# Patient Record
Sex: Male | Born: 1950 | Race: White | Hispanic: No | Smoking: Current every day smoker
Health system: Southern US, Community
[De-identification: ages and names within clinical notes are randomized; demographics above are authoritative.]

## PROBLEM LIST (undated history)

## (undated) DIAGNOSIS — K219 Gastro-esophageal reflux disease without esophagitis: Secondary | ICD-10-CM

## (undated) DIAGNOSIS — N189 Chronic kidney disease, unspecified: Secondary | ICD-10-CM

## (undated) DIAGNOSIS — I10 Essential (primary) hypertension: Secondary | ICD-10-CM

## (undated) DIAGNOSIS — I639 Cerebral infarction, unspecified: Secondary | ICD-10-CM

## (undated) HISTORY — PX: ROTATOR CUFF REPAIR: SHX139

## (undated) HISTORY — PX: SPINE SURGERY: SHX786

## (undated) HISTORY — PX: CHOLECYSTECTOMY: SHX55

## (undated) HISTORY — DX: Chronic kidney disease, unspecified: N18.9

## (undated) HISTORY — DX: Gastro-esophageal reflux disease without esophagitis: K21.9

## (undated) HISTORY — DX: Cerebral infarction, unspecified: I63.9

## (undated) HISTORY — DX: Essential (primary) hypertension: I10

---

## 2015-01-23 NOTE — Progress Notes (Signed)
Subjective:  Patient ID: Jonathon Wang, male    DOB: 01/15/1951  Age: 64 y.o. MRN: [id number]  CC: Establish Care; Hypertension; and Gastroesophageal Reflux   HPI Jonathon Wang presents for HTN onset 12 years ago. At the time had trigeminal neuralgia. A few years later had renal Ca. R nephrectomy performed for cure,but BP difficult to control since that time. Taking 4 meds. Two at max dose plus hydralazine.   Anterior right thigh pain with walking. Radiating into the groin. Onset 2 weeks ago.Has built since to now it is 10/10 pain.Described as Hard pain. Feels like the bone is hurting. Denies weakness.   Patient has a history of reflux, but notes that PPI taken OTC has been helping until recently. Now pain is worsening, but feels different from the reflux. Focused more in the right upper to periumbilical region. Constant, but does not affect appetite.   Pt. Reports urinary frequency, hesitancy and nocturia X 3-4. This is worse if he misses his tamsulosin. Loss of sleep leads to tiredness the next day and diminishes his energy and ability to perform usual aactivities.  History Jonathon Wang has a past medical history of Hypertension; GERD (gastroesophageal reflux disease); CKD (chronic kidney disease); and Stroke ([place]).   He has past surgical history that includes Spine surgery; Rotator cuff repair (Right); and Cholecystectomy.   His family history includes Cancer (age of onset: 10) in his brother; Heart disease in his brother and father; Kidney disease in his brother and mother; Stroke in his father.He reports that he has been smoking Cigarettes.  He started smoking about 50 years ago. He has been smoking about 0.50 packs per day. He does not have any smokeless tobacco history on file. He reports that he drinks about 1.2 oz of alcohol per week. His drug history is not on file.  No outpatient prescriptions prior to visit.   No facility-administered medications prior to visit.    ROS Review of  Systems  Constitutional: Negative for fever, chills, diaphoresis, activity change, appetite change, fatigue and unexpected weight change.  HENT: Negative for congestion, ear pain, hearing loss, postnasal drip, rhinorrhea, sore throat, tinnitus and trouble swallowing.   Eyes: Negative for photophobia, pain, discharge and redness.  Respiratory: Negative for apnea, cough, choking, chest tightness, shortness of breath, wheezing and stridor.   Cardiovascular: Negative for chest pain, palpitations and leg swelling.  Gastrointestinal: Positive for nausea, abdominal pain and abdominal distention. Negative for vomiting, diarrhea, constipation and blood in stool.  Endocrine: Negative for cold intolerance, heat intolerance, polydipsia, polyphagia and polyuria.  Genitourinary: Positive for difficulty urinating. Negative for dysuria, urgency, frequency, hematuria, flank pain, enuresis and genital sores.  Musculoskeletal: Positive for myalgias and arthralgias. Negative for joint swelling.  Skin: Negative for color change, rash and wound.  Allergic/Immunologic: Negative for immunocompromised state.  Neurological: Negative for dizziness, tremors, seizures, syncope, facial asymmetry, speech difficulty, weakness, light-headedness, numbness and headaches.  Hematological: Does not bruise/bleed easily.  Psychiatric/Behavioral: Negative for suicidal ideas, hallucinations, behavioral problems, confusion, sleep disturbance, dysphoric mood, decreased concentration and agitation. The patient is not nervous/anxious and is not hyperactive.     Objective:  BP 161/69 mmHg  Pulse 106  Temp(Src) 97.9 F (36.6 C) (Oral)  Ht '5\' 5"'  (1.651 m)  Wt 136 lb 6.4 oz (61.871 kg)  BMI 22.70 kg/m2  SpO2 97%  BP Readings from Last 3 Encounters:  01/24/15 161/69    Wt Readings from Last 3 Encounters:  01/24/15 136 lb 6.4 oz (61.871 kg)  Physical Exam  Constitutional: He is oriented to person, place, and time. He appears  well-developed and well-nourished. No distress.  HENT:  Head: Normocephalic and atraumatic.  Right Ear: External ear normal.  Left Ear: External ear normal.  Nose: Nose normal.  Mouth/Throat: Oropharynx is clear and moist.  Eyes: Conjunctivae and EOM are normal. Pupils are equal, round, and reactive to light.  Neck: Normal range of motion. Neck supple. No thyromegaly present.  Cardiovascular: Normal rate, regular rhythm and normal heart sounds.   No murmur heard. Pulmonary/Chest: Effort normal and breath sounds normal. No respiratory distress. He has no wheezes. He has no rales.  Abdominal: Soft. Bowel sounds are normal. He exhibits no distension. There is no tenderness.  Lymphadenopathy:    He has no cervical adenopathy.  Neurological: He is alert and oriented to person, place, and time. He has normal reflexes.  Skin: Skin is warm and dry.  Psychiatric: He has a normal mood and affect. His behavior is normal. Judgment and thought content normal.    No results found for: HGBA1C  No results found for: WBC, HGB, HCT, PLT, GLUCOSE, CHOL, TRIG, HDL, LDLDIRECT, LDLCALC, ALT, AST, NA, K, CL, CREATININE, BUN, CO2, TSH, PSA, INR, GLUF, HGBA1C, MICROALBUR  Patient was never admitted.  Assessment & Plan:   Jonathon Wang was seen today for establish care, hypertension and gastroesophageal reflux.  Diagnoses and all orders for this visit:  Right thigh pain -     CBC with Differential/Platelet -     CMP14+EGFR -     Sedimentation rate -     DG Pelvis 1-2 Views; Future -     DG FEMUR, MIN 2 VIEWS RIGHT; Future  Periumbilical abdominal pain -     CBC with Differential/Platelet -     CMP14+EGFR -     Lipase -     DG Abd Acute W/Chest; Future -     CT Abdomen Pelvis W Contrast; Future -     US Abdomen Limited RUQ; Future  Renovascular hypertension -     CBC with Differential/Platelet -     CMP14+EGFR -     Lipid panel -     NM Renal Imaging Flow W/Pharm; Future -     POCT UA -  Microscopic Only -     POCT urinalysis dipstick  BPH (benign prostatic hyperplasia) -     CBC with Differential/Platelet -     CMP14+EGFR  Screening for prostate cancer -     PSA, Medicare  Other orders -     tamsulosin (FLOMAX) 0.4 MG CAPS capsule; Take 2 capsules (0.8 mg total) by mouth daily. -     hydrALAZINE (APRESOLINE) 50 MG tablet; Take 1 tablet (50 mg total) by mouth 3 (three) times daily. For prostate -     predniSONE (DELTASONE) 10 MG tablet; Take 5 daily for 3 days followed by 4,3,2 and 1 for 3 days each. -     gabapentin (NEURONTIN) 300 MG capsule; Take 1 capsule (300 mg total) by mouth at bedtime. For right leg pain   I have changed Mr. Schneller tamsulosin and hydrALAZINE. I am also having him start on predniSONE and gabapentin. Additionally, I am having him maintain his amLODipine, lisinopril, metoprolol tartrate, and omeprazole.  Meds ordered this encounter  Medications  . amLODipine (NORVASC) 10 MG tablet    Sig: Take 10 mg by mouth daily.    Refill:  1  . lisinopril (PRINIVIL,ZESTRIL) 40 MG tablet    Sig: Take  40 mg by mouth daily.    Refill:  1  . DISCONTD: tamsulosin (FLOMAX) 0.4 MG CAPS capsule    Sig: Take 0.4 mg by mouth daily.    Refill:  1  . metoprolol tartrate (LOPRESSOR) 25 MG tablet    Sig: Take 12.5 mg by mouth 2 (two) times daily.    Refill:  1  . DISCONTD: hydrALAZINE (APRESOLINE) 50 MG tablet    Sig: Take 50 mg by mouth 2 (two) times daily.    Refill:  2  . omeprazole (PRILOSEC) 20 MG capsule    Sig: Take 40 mg by mouth daily.    Refill:  0  . tamsulosin (FLOMAX) 0.4 MG CAPS capsule    Sig: Take 2 capsules (0.8 mg total) by mouth daily.    Dispense:  60 capsule    Refill:  5  . hydrALAZINE (APRESOLINE) 50 MG tablet    Sig: Take 1 tablet (50 mg total) by mouth 3 (three) times daily. For prostate    Dispense:  90 tablet    Refill:  2  . predniSONE (DELTASONE) 10 MG tablet    Sig: Take 5 daily for 3 days followed by 4,3,2 and 1 for 3 days  each.    Dispense:  45 tablet    Refill:  0  . gabapentin (NEURONTIN) 300 MG capsule    Sig: Take 1 capsule (300 mg total) by mouth at bedtime. For right leg pain    Dispense:  30 capsule    Refill:  0   XR abd shows increased bowel gas and possible aealy obstruction. There is no apparent mass. Some changes at the right lateral abd may be based on right nephrectomy causing shifting of positioning of vicera.  Pelvis/hip shows moderate arthritic changes at the acetabulum with decreased joint space.   Follow-up: Return in about 2 weeks (around 02/07/2015).  Claretta Fraise, M.D.

## 2015-01-24 ENCOUNTER — Ambulatory Visit (INDEPENDENT_AMBULATORY_CARE_PROVIDER_SITE_OTHER): Payer: Medicare HMO

## 2015-01-24 ENCOUNTER — Other Ambulatory Visit (HOSPITAL_COMMUNITY): Payer: Self-pay

## 2015-01-24 ENCOUNTER — Ambulatory Visit (INDEPENDENT_AMBULATORY_CARE_PROVIDER_SITE_OTHER): Payer: Medicare HMO | Admitting: Family Medicine

## 2015-01-24 ENCOUNTER — Encounter: Payer: Self-pay | Admitting: Family Medicine

## 2015-01-24 VITALS — BP 161/69 | HR 106 | Temp 97.9°F | Ht 65.0 in | Wt 136.4 lb

## 2015-01-24 DIAGNOSIS — M79651 Pain in right thigh: Secondary | ICD-10-CM

## 2015-01-24 DIAGNOSIS — Z125 Encounter for screening for malignant neoplasm of prostate: Secondary | ICD-10-CM

## 2015-01-24 DIAGNOSIS — R1033 Periumbilical pain: Secondary | ICD-10-CM | POA: Diagnosis not present

## 2015-01-24 DIAGNOSIS — N400 Enlarged prostate without lower urinary tract symptoms: Secondary | ICD-10-CM | POA: Diagnosis not present

## 2015-01-24 DIAGNOSIS — I150 Renovascular hypertension: Secondary | ICD-10-CM

## 2015-01-24 LAB — POCT URINALYSIS DIPSTICK
Bilirubin, UA: NEGATIVE
GLUCOSE UA: NEGATIVE
Ketones, UA: NEGATIVE
Leukocytes, UA: NEGATIVE
NITRITE UA: NEGATIVE
PH UA: 6
RBC UA: NEGATIVE
SPEC GRAV UA: 1.015
UROBILINOGEN UA: NEGATIVE

## 2015-01-24 LAB — POCT UA - MICROSCOPIC ONLY
BACTERIA, U MICROSCOPIC: NEGATIVE
CASTS, UR, LPF, POC: NEGATIVE
CRYSTALS, UR, HPF, POC: NEGATIVE
Mucus, UA: NEGATIVE
RBC, urine, microscopic: NEGATIVE
YEAST UA: NEGATIVE

## 2015-01-24 MED ORDER — HYDRALAZINE HCL 50 MG PO TABS
50.0000 mg | ORAL_TABLET | Freq: Three times a day (TID) | ORAL | Status: DC
Start: 2015-01-24 — End: 2015-02-07

## 2015-01-24 MED ORDER — PREDNISONE 10 MG PO TABS: ORAL_TABLET | ORAL | Status: DC

## 2015-01-24 MED ORDER — GABAPENTIN 300 MG PO CAPS: 300.0000 mg | ORAL_CAPSULE | Freq: Every day | ORAL | Status: DC

## 2015-01-24 MED ORDER — TAMSULOSIN HCL 0.4 MG PO CAPS: 0.8000 mg | ORAL_CAPSULE | Freq: Every day | ORAL | Status: DC

## 2015-01-25 ENCOUNTER — Other Ambulatory Visit: Payer: Self-pay | Admitting: Family Medicine

## 2015-01-25 ENCOUNTER — Ambulatory Visit (HOSPITAL_COMMUNITY)
Admission: RE | Admit: 2015-01-25 | Discharge: 2015-01-25 | Disposition: A | Discharge status: Home / Self Care | Payer: Medicare HMO | Source: Ambulatory Visit | Attending: Family Medicine | Admitting: Family Medicine

## 2015-01-25 DIAGNOSIS — D3502 Benign neoplasm of left adrenal gland: Secondary | ICD-10-CM | POA: Diagnosis not present

## 2015-01-25 DIAGNOSIS — Z85528 Personal history of other malignant neoplasm of kidney: Secondary | ICD-10-CM | POA: Diagnosis not present

## 2015-01-25 DIAGNOSIS — Z9049 Acquired absence of other specified parts of digestive tract: Secondary | ICD-10-CM | POA: Diagnosis not present

## 2015-01-25 DIAGNOSIS — N400 Enlarged prostate without lower urinary tract symptoms: Secondary | ICD-10-CM | POA: Diagnosis not present

## 2015-01-25 DIAGNOSIS — R1033 Periumbilical pain: Secondary | ICD-10-CM

## 2015-01-25 DIAGNOSIS — N200 Calculus of kidney: Secondary | ICD-10-CM | POA: Diagnosis not present

## 2015-01-25 DIAGNOSIS — R1011 Right upper quadrant pain: Secondary | ICD-10-CM | POA: Diagnosis present

## 2015-01-25 DIAGNOSIS — R93421 Abnormal radiologic findings on diagnostic imaging of right kidney: Secondary | ICD-10-CM | POA: Diagnosis not present

## 2015-01-25 LAB — CMP14+EGFR
ALK PHOS: 47 IU/L (ref 39–117)
ALT: 10 IU/L (ref 0–44)
AST: 19 IU/L (ref 0–40)
Albumin/Globulin Ratio: 1.9 (ref 1.1–2.5)
Albumin: 4.5 g/dL (ref 3.6–4.8)
BUN/Creatinine Ratio: 20 (ref 10–22)
BUN: 36 mg/dL — AB (ref 8–27)
Bilirubin Total: 0.7 mg/dL (ref 0.0–1.2)
CO2: 22 mmol/L (ref 18–29)
CREATININE: 1.83 mg/dL — AB (ref 0.76–1.27)
Calcium: 9.7 mg/dL (ref 8.6–10.2)
Chloride: 103 mmol/L (ref 97–106)
GFR calc Af Amer: 44 mL/min/{1.73_m2} — ABNORMAL LOW (ref >59)
GFR calc non Af Amer: 38 mL/min/{1.73_m2} — ABNORMAL LOW (ref >59)
GLUCOSE: 95 mg/dL (ref 65–99)
Globulin, Total: 2.4 g/dL (ref 1.5–4.5)
Potassium: 5.3 mmol/L — ABNORMAL HIGH (ref 3.5–5.2)
SODIUM: 142 mmol/L (ref 136–144)
Total Protein: 6.9 g/dL (ref 6.0–8.5)

## 2015-01-25 LAB — SEDIMENTATION RATE: Sed Rate: 18 mm/hr (ref 0–30)

## 2015-01-25 LAB — LIPID PANEL
CHOLESTEROL TOTAL: 178 mg/dL (ref 100–199)
Chol/HDL Ratio: 3 ratio units (ref 0.0–5.0)
HDL: 60 mg/dL (ref >39)
LDL CALC: 82 mg/dL (ref 0–99)
TRIGLYCERIDES: 179 mg/dL — AB (ref 0–149)
VLDL CHOLESTEROL CAL: 36 mg/dL (ref 5–40)

## 2015-01-25 LAB — CBC WITH DIFFERENTIAL/PLATELET
BASOS ABS: 0 10*3/uL (ref 0.0–0.2)
Basos: 1 %
EOS (ABSOLUTE): 0.2 10*3/uL (ref 0.0–0.4)
Eos: 2 %
Hematocrit: 39.8 % (ref 37.5–51.0)
Hemoglobin: 13 g/dL (ref 12.6–17.7)
Immature Grans (Abs): 0 10*3/uL (ref 0.0–0.1)
Immature Granulocytes: 0 %
LYMPHS ABS: 2 10*3/uL (ref 0.7–3.1)
Lymphs: 26 %
MCH: 32.3 pg (ref 26.6–33.0)
MCHC: 32.7 g/dL (ref 31.5–35.7)
MCV: 99 fL — ABNORMAL HIGH (ref 79–97)
MONOS ABS: 0.7 10*3/uL (ref 0.1–0.9)
Monocytes: 9 %
NEUTROS PCT: 62 %
Neutrophils Absolute: 4.7 10*3/uL (ref 1.4–7.0)
PLATELETS: 400 10*3/uL — AB (ref 150–379)
RBC: 4.03 x10E6/uL — AB (ref 4.14–5.80)
RDW: 13.2 % (ref 12.3–15.4)
WBC: 7.5 10*3/uL (ref 3.4–10.8)

## 2015-01-25 LAB — PSA (REFLEX TO FREE) (SERIAL): Prostate Specific Ag, Serum: 3.5 ng/mL (ref 0.0–4.0)

## 2015-01-25 LAB — POCT I-STAT CREATININE: Creatinine, Ser: 2.3 mg/dL — ABNORMAL HIGH (ref 0.61–1.24)

## 2015-01-25 LAB — LIPASE: LIPASE: 97 U/L — AB (ref 0–59)

## 2015-01-25 MED ORDER — IOHEXOL 300 MG/ML  SOLN: 100.0000 mL | Freq: Once | INTRAMUSCULAR | Status: DC | PRN

## 2015-01-26 ENCOUNTER — Other Ambulatory Visit: Payer: Self-pay | Admitting: *Deleted

## 2015-01-26 DIAGNOSIS — N2889 Other specified disorders of kidney and ureter: Secondary | ICD-10-CM

## 2015-02-01 ENCOUNTER — Other Ambulatory Visit: Payer: Self-pay | Admitting: Family Medicine

## 2015-02-02 ENCOUNTER — Other Ambulatory Visit: Payer: Self-pay | Admitting: Family Medicine

## 2015-02-04 ENCOUNTER — Other Ambulatory Visit: Payer: Self-pay | Admitting: Family Medicine

## 2015-02-04 ENCOUNTER — Telehealth: Payer: Self-pay | Admitting: Family Medicine

## 2015-02-07 ENCOUNTER — Encounter: Payer: Self-pay | Admitting: Family Medicine

## 2015-02-07 ENCOUNTER — Ambulatory Visit (INDEPENDENT_AMBULATORY_CARE_PROVIDER_SITE_OTHER): Payer: Medicare HMO | Admitting: Family Medicine

## 2015-02-07 VITALS — BP 158/76 | HR 71 | Temp 97.7°F | Ht 65.0 in | Wt 137.2 lb

## 2015-02-07 DIAGNOSIS — M79651 Pain in right thigh: Secondary | ICD-10-CM | POA: Diagnosis not present

## 2015-02-07 DIAGNOSIS — I150 Renovascular hypertension: Secondary | ICD-10-CM

## 2015-02-07 MED ORDER — DICLOFENAC SODIUM 75 MG PO TBEC: 75.0000 mg | DELAYED_RELEASE_TABLET | Freq: Two times a day (BID) | ORAL | Status: DC

## 2015-02-07 MED ORDER — LISINOPRIL 40 MG PO TABS: 40.0000 mg | ORAL_TABLET | Freq: Every day | ORAL | Status: DC

## 2015-02-07 MED ORDER — HYDRALAZINE HCL 50 MG PO TABS: 50.0000 mg | ORAL_TABLET | Freq: Three times a day (TID) | ORAL | Status: DC

## 2015-02-07 MED ORDER — GABAPENTIN 300 MG PO CAPS
ORAL_CAPSULE | ORAL | Status: DC
Start: 2015-02-07 — End: 2015-03-30

## 2015-02-07 MED ORDER — AMLODIPINE BESYLATE 10 MG PO TABS: 10.0000 mg | ORAL_TABLET | Freq: Every day | ORAL | Status: DC

## 2015-02-07 NOTE — Progress Notes (Signed)
Subjective:  Patient ID: Jonathon Wang, male    DOB: Jun 11, 1950  Age: 64 y.o. MRN: [id number]  CC: Hypertension and Abdominal Pain   HPI Masami Samson presents for ABd pain gone, but R leg no better. Feels like bones hurt. Severe pain still. Sharp, hard pain. Located at [facility] area bilaterally. Also radiates to anterior right thigh. Pt. Declines w/u for renal mass. Makes ambulation difficult and painful. Ambulation exacerbates the pain. Patient notes that the pain is definitely anterior and not similar to previous episodes of sciatica.   follow-up of hypertension. Patient has no history of headache chest pain or shortness of breath or recent cough. Patient also denies symptoms of TIA such as numbness weakness lateralizing. Patient does not check  blood pressure at home . Patient denies side effects from his medication. States taking it regularly. However he did not increase the hydralazine from twice a day to 3 times a day as recommended last visit    Zai has a past medical history of Hypertension; GERD (gastroesophageal reflux disease); CKD (chronic kidney disease); and Stroke ([place]).   He has past surgical history that includes Spine surgery; Rotator cuff repair (Right); and Cholecystectomy.   His family history includes Cancer (age of onset: 33) in his brother; Heart disease in his brother and father; Kidney disease in his brother and mother; Stroke in his father.He reports that he has been smoking Cigarettes.  He started smoking about 50 years ago. He has been smoking about 0.50 packs per day. He does not have any smokeless tobacco history on file. He reports that he drinks about 1.2 oz of alcohol per week. His drug history is not on file.  Outpatient Prescriptions Prior to Visit  Medication Sig Dispense Refill  . metoprolol tartrate (LOPRESSOR) 25 MG tablet Take 12.5 mg by mouth 2 (two) times daily.  1  . omeprazole (PRILOSEC) 20 MG capsule TAKE 2 CAPSULES BY MOUTH EVERY DAY 60 capsule 5    . tamsulosin (FLOMAX) 0.4 MG CAPS capsule Take 2 capsules (0.8 mg total) by mouth daily. 60 capsule 5  . amLODipine (NORVASC) 10 MG tablet Take 10 mg by mouth daily.  1  . hydrALAZINE (APRESOLINE) 50 MG tablet Take 1 tablet (50 mg total) by mouth 3 (three) times daily. For prostate 90 tablet 2  . lisinopril (PRINIVIL,ZESTRIL) 40 MG tablet Take 40 mg by mouth daily.  1  . gabapentin (NEURONTIN) 300 MG capsule Take 1 capsule (300 mg total) by mouth at bedtime. For right leg pain (Patient not taking: Reported on 02/07/2015) 30 capsule 0  . predniSONE (DELTASONE) 10 MG tablet Take 5 daily for 3 days followed by 4,3,2 and 1 for 3 days each. (Patient not taking: Reported on 02/07/2015) 45 tablet 0   No facility-administered medications prior to visit.    ROS Review of Systems  Constitutional: Negative for fever, chills and diaphoresis.  HENT: Negative for congestion, rhinorrhea and sore throat.   Respiratory: Negative for cough, shortness of breath and wheezing.   Cardiovascular: Negative for chest pain.  Gastrointestinal: Negative for nausea, vomiting, abdominal pain, diarrhea, constipation and abdominal distention.  Genitourinary: Negative for dysuria and frequency.  Musculoskeletal: Positive for back pain, joint swelling and arthralgias.  Skin: Negative for rash.  Neurological: Negative for headaches.    Objective:  BP 158/76 mmHg  Pulse 71  Temp(Src) 97.7 F (36.5 C) (Oral)  Ht 5\' 5"  (1.651 m)  Wt 137 lb 3.2 oz (62.234 kg)  BMI 22.83 kg/m2  SpO2  97%  BP Readings from Last 3 Encounters:  02/07/15 158/76  01/24/15 161/69    Wt Readings from Last 3 Encounters:  02/07/15 137 lb 3.2 oz (62.234 kg)  01/24/15 136 lb 6.4 oz (61.871 kg)     Physical Exam  Constitutional: He is oriented to person, place, and time. He appears well-developed and well-nourished. No distress.  HENT:  Head: Normocephalic and atraumatic.  Right Ear: External ear normal.  Left Ear: External ear  normal.  Nose: Nose normal.  Mouth/Throat: Oropharynx is clear and moist.  Eyes: Conjunctivae and EOM are normal. Pupils are equal, round, and reactive to light.  Neck: Normal range of motion. Neck supple. No thyromegaly present.  Cardiovascular: Normal rate, regular rhythm and normal heart sounds.   No murmur heard. Pulmonary/Chest: Effort normal and breath sounds normal. No respiratory distress. He has no wheezes. He has no rales.  Abdominal: Soft. Bowel sounds are normal. He exhibits no distension. There is no tenderness.  Musculoskeletal: Normal range of motion. He exhibits tenderness (right anterior thigh and b bilateral SI joints for palpation). He exhibits no edema.  Lymphadenopathy:    He has no cervical adenopathy.  Neurological: He is alert and oriented to person, place, and time. He has normal reflexes.  Skin: Skin is warm and dry.  Psychiatric: He has a normal mood and affect. His behavior is normal. Judgment and thought content normal.    No results found for: HGBA1C  Lab Results  Component Value Date   WBC 7.5 01/24/2015   HCT 39.8 01/24/2015   GLUCOSE 95 01/24/2015   CHOL 178 01/24/2015   TRIG 179* 01/24/2015   HDL 60 01/24/2015   LDLCALC 82 01/24/2015   ALT 10 01/24/2015   AST 19 01/24/2015   NA 142 01/24/2015   K 5.3* 01/24/2015   CL 103 01/24/2015   CREATININE 2.30* 01/25/2015   BUN 36* 01/24/2015   CO2 22 01/24/2015   PSA 3.5 01/24/2015    Ct Abdomen Pelvis Wo Contrast  01/25/2015  CLINICAL DATA:  Right upper quadrant pain for 4 days. History of right kidney cancer 7 years ago. Creatinine 2.3. EXAM: CT ABDOMEN AND PELVIS WITHOUT CONTRAST TECHNIQUE: Multidetector CT imaging of the abdomen and pelvis was performed following the standard protocol without IV contrast. COMPARISON:  None. FINDINGS: Lower chest: No acute findings. Mild scarring/atelectasis at each lung base. Hepatobiliary: Status post cholecystectomy. No surgical complicating feature identified. No  focal masses or lesions identified within the liver on this nonenhanced exam. Pancreas: Unremarkable.  No mass or inflammatory change identified. Spleen: Within normal limits in size. Adrenals/Urinary Tract: Left adrenal mass measures 3.1 x 1.7 x 2.6 cm, consistent with benign lipid-rich adrenal adenoma based on CT density measurements. Right adrenal gland is unremarkable. Right kidney is surgically absent. No evidence of local recurrent disease. Benign cyst within the upper pole of the left kidney anteriorly measures 1.5 x 1.2 cm. Hyperdense mass exophytic to the posterior cortex of the left kidney measures 1.3 x 1 cm, most likely a benign hemorrhagic cyst. Subtle 2.1 x 1.3 cm lobular mass exophytic to the lateral cortex of the lower left kidney is of uncertain significance and difficult to definitively characterize on this nonenhanced study. 1 mm nonobstructing left renal stone also noted. Stomach/Bowel: Stomach is unremarkable. There are no dilated large or small bowel loops. No bowel wall thickening or evidence of bowel wall inflammation seen. Moderate amount of stool and gas within the nondistended colon. Appendix is not convincingly seen but  there are no inflammatory changes in the right lower quadrant to suggest acute appendicitis. Vascular/Lymphatic: Prominent atherosclerotic vascular calcifications are seen along the walls of the normal-caliber abdominal aorta and pelvic vasculature. Reproductive: Prostate gland is moderately enlarged causing slight mass effect on the bladder base. Otherwise unremarkable. Other: No free fluid or abscess collections seen. No free intraperitoneal air. No enlarged lymph nodes seen. Musculoskeletal: Degenerative changes throughout the thoracolumbar spine but no acute osseous abnormality. Incidental note made of a benign bone island within the right femoral head. Superficial soft tissues are unremarkable. IMPRESSION: 1. Subtle nonspecific lobular mass exophytic to the lateral  cortex of the lower left kidney (series 2, image 36; series 3, image 46), measuring 2.1 x 1.3 cm, difficult to characterize without intravascular contrast. Neoplastic mass cannot be excluded. Recommend further characterization with renal MRI. 2. Probably benign simple and hemorrhagic cysts within the upper left kidney. 3. 1 mm nonobstructing left renal stone. 4. Benign left adrenal adenoma. 5. Status post surgical resection of the right kidney. No evidence of local residual or recurrent disease. No surgical complicating feature. 6. Status post cholecystectomy. 7. Moderate prostate gland enlargement causing slight mass effect on the bladder base. 8. Remainder of the abdomen and pelvis CT is unremarkable. No free fluid or inflammatory change. No bowel obstruction or evidence of bowel wall inflammation. No evidence of acute solid organ abnormality. Electronically Signed   By: Franki Cabot M.D.   On: 01/25/2015 13:36    Assessment & Plan:   There are no diagnoses linked to this encounter. I have discontinued Mr. Strupp's predniSONE. I have also changed his lisinopril, amLODipine, hydrALAZINE, and gabapentin. Additionally, I am having him start on diclofenac. Lastly, I am having him maintain his metoprolol tartrate, tamsulosin, and omeprazole.  Meds ordered this encounter  Medications  . lisinopril (PRINIVIL,ZESTRIL) 40 MG tablet    Sig: Take 1 tablet (40 mg total) by mouth daily.    Dispense:  90 tablet    Refill:  1  . amLODipine (NORVASC) 10 MG tablet    Sig: Take 1 tablet (10 mg total) by mouth daily. For blood pressure    Dispense:  90 tablet    Refill:  1  . hydrALAZINE (APRESOLINE) 50 MG tablet    Sig: Take 1 tablet (50 mg total) by mouth 3 (three) times daily. For blood pressure    Dispense:  90 tablet    Refill:  2  . gabapentin (NEURONTIN) 300 MG capsule    Sig: For right leg pain. One at night for 3 days. Then 2 for three days, then 3 for three days, then 4 at night.    Dispense:  30  capsule    Refill:  0  . diclofenac (VOLTAREN) 75 MG EC tablet    Sig: Take 1 tablet (75 mg total) by mouth 2 (two) times daily.    Dispense:  60 tablet    Refill:  2     Follow-up: Return in about 4 weeks (around 03/07/2015) for hypertension, Arthritis.  Claretta Fraise, M.D.

## 2015-02-09 ENCOUNTER — Ambulatory Visit (HOSPITAL_COMMUNITY): Payer: Medicare HMO

## 2015-02-09 ENCOUNTER — Encounter (HOSPITAL_COMMUNITY): Payer: Medicare HMO

## 2015-03-07 ENCOUNTER — Other Ambulatory Visit: Payer: Self-pay | Admitting: Family Medicine

## 2015-03-17 ENCOUNTER — Ambulatory Visit: Payer: Medicare HMO | Admitting: Family Medicine

## 2015-03-22 ENCOUNTER — Ambulatory Visit: Payer: Medicare HMO | Admitting: Family Medicine

## 2015-03-28 ENCOUNTER — Ambulatory Visit: Payer: Medicare HMO | Admitting: Family Medicine

## 2015-03-30 ENCOUNTER — Ambulatory Visit (INDEPENDENT_AMBULATORY_CARE_PROVIDER_SITE_OTHER): Payer: Medicare HMO | Admitting: Family Medicine

## 2015-03-30 ENCOUNTER — Encounter: Payer: Self-pay | Admitting: Family Medicine

## 2015-03-30 ENCOUNTER — Ambulatory Visit (INDEPENDENT_AMBULATORY_CARE_PROVIDER_SITE_OTHER): Payer: Medicare HMO

## 2015-03-30 VITALS — BP 166/88 | HR 73 | Temp 98.0°F | Ht 65.0 in | Wt 134.0 lb

## 2015-03-30 DIAGNOSIS — M545 Low back pain, unspecified: Secondary | ICD-10-CM

## 2015-03-30 DIAGNOSIS — I150 Renovascular hypertension: Secondary | ICD-10-CM | POA: Diagnosis not present

## 2015-03-30 DIAGNOSIS — M79651 Pain in right thigh: Secondary | ICD-10-CM

## 2015-03-30 MED ORDER — HYDRALAZINE HCL 100 MG PO TABS
100.0000 mg | ORAL_TABLET | Freq: Three times a day (TID) | ORAL | Status: DC
Start: 2015-03-30 — End: 2015-07-29

## 2015-03-30 MED ORDER — GABAPENTIN 400 MG PO CAPS: ORAL_CAPSULE | ORAL | Status: DC

## 2015-03-30 NOTE — Progress Notes (Signed)
Subjective:  Patient ID: Jonathon Wang, male    DOB: 07-02-1950  Age: 65 y.o. MRN: [id number]  CC: Hypertension and Pain   HPI Christien Petrauskas presents for  follow-up of hypertension. Patient has no history of headache chest pain or shortness of breath or recent cough. Patient also denies symptoms of TIA such as numbness weakness lateralizing. Patient checks  blood pressure at home and has not had any elevated readings recently. Patient denies side effects from his medication. States taking it regularly.  Moderately severe recurrent low back pain is radiating into the right posterior thigh. Pain onset remote. Pain in back is sharp at times with dull ache in between episodes.    History Lawrene has a past medical history of Hypertension; GERD (gastroesophageal reflux disease); CKD (chronic kidney disease); and Stroke ([place]).   He has past surgical history that includes Spine surgery; Rotator cuff repair (Right); and Cholecystectomy.   His family history includes Cancer (age of onset: 103) in his brother; Heart disease in his brother and father; Kidney disease in his brother and mother; Stroke in his father.He reports that he has been smoking Cigarettes.  He started smoking about 50 years ago. He has been smoking about 0.50 packs per day. He does not have any smokeless tobacco history on file. He reports that he drinks about 1.2 oz of alcohol per week. His drug history is not on file.  Current Outpatient Prescriptions on File Prior to Visit  Medication Sig Dispense Refill  . amLODipine (NORVASC) 10 MG tablet Take 1 tablet (10 mg total) by mouth daily. For blood pressure 90 tablet 1  . lisinopril (PRINIVIL,ZESTRIL) 40 MG tablet Take 1 tablet (40 mg total) by mouth daily. 90 tablet 1  . metoprolol tartrate (LOPRESSOR) 25 MG tablet TAKE 1/2 TABLET BY MOUTH TWICE DAILY 30 tablet 4  . omeprazole (PRILOSEC) 20 MG capsule TAKE 2 CAPSULES BY MOUTH EVERY DAY 60 capsule 5  . tamsulosin (FLOMAX) 0.4 MG CAPS  capsule Take 2 capsules (0.8 mg total) by mouth daily. 60 capsule 5   No current facility-administered medications on file prior to visit.    ROS Review of Systems  Constitutional: Negative for fever, chills, diaphoresis and unexpected weight change.  HENT: Negative for congestion, hearing loss, rhinorrhea and sore throat.   Eyes: Negative for visual disturbance.  Respiratory: Negative for cough and shortness of breath.   Cardiovascular: Negative for chest pain.  Gastrointestinal: Negative for abdominal pain, diarrhea and constipation.  Genitourinary: Negative for dysuria and flank pain.  Musculoskeletal: Positive for myalgias and back pain. Negative for joint swelling and arthralgias.  Skin: Negative for rash.  Neurological: Negative for dizziness and headaches.  Psychiatric/Behavioral: Negative for sleep disturbance and dysphoric mood.    Objective:  BP 166/88 mmHg  Pulse 73  Temp(Src) 98 F (36.7 C) (Oral)  Ht 5\' 5"  (1.651 m)  Wt 134 lb (60.782 kg)  BMI 22.30 kg/m2  SpO2 96%  BP Readings from Last 3 Encounters:  03/30/15 166/88  02/07/15 158/76  01/24/15 161/69    Wt Readings from Last 3 Encounters:  03/30/15 134 lb (60.782 kg)  02/07/15 137 lb 3.2 oz (62.234 kg)  01/24/15 136 lb 6.4 oz (61.871 kg)     Physical Exam  Constitutional: He is oriented to person, place, and time. He appears well-developed and well-nourished. No distress.  HENT:  Head: Normocephalic and atraumatic.  Right Ear: External ear normal.  Left Ear: External ear normal.  Nose: Nose normal.  Mouth/Throat:  Oropharynx is clear and moist.  Eyes: Conjunctivae and EOM are normal. Pupils are equal, round, and reactive to light.  Neck: Normal range of motion. Neck supple. No thyromegaly present.  Cardiovascular: Normal rate, regular rhythm and normal heart sounds.   No murmur heard. Pulmonary/Chest: Effort normal and breath sounds normal. No respiratory distress. He has no wheezes. He has no  rales.  Abdominal: Soft. Bowel sounds are normal. He exhibits no distension. There is no tenderness.  Musculoskeletal: Normal range of motion. He exhibits tenderness (right lumbar region paraspinous spasm noted L5-S1. SLR positive. Saralyn Pilar neg. on right.).  Lymphadenopathy:    He has no cervical adenopathy.  Neurological: He is alert and oriented to person, place, and time. He has normal reflexes.  Skin: Skin is warm and dry.  Psychiatric: He has a normal mood and affect. His behavior is normal. Judgment and thought content normal.    No results found for: HGBA1C  Lab Results  Component Value Date   WBC 7.5 01/24/2015   HCT 39.8 01/24/2015   PLT 400* 01/24/2015   GLUCOSE 95 01/24/2015   CHOL 178 01/24/2015   TRIG 179* 01/24/2015   HDL 60 01/24/2015   LDLCALC 82 01/24/2015   ALT 10 01/24/2015   AST 19 01/24/2015   NA 142 01/24/2015   K 5.3* 01/24/2015   CL 103 01/24/2015   CREATININE 2.30* 01/25/2015   BUN 36* 01/24/2015   CO2 22 01/24/2015    Ct Abdomen Pelvis Wo Contrast  01/25/2015  CLINICAL DATA:  Right upper quadrant pain for 4 days. History of right kidney cancer 7 years ago. Creatinine 2.3. EXAM: CT ABDOMEN AND PELVIS WITHOUT CONTRAST TECHNIQUE: Multidetector CT imaging of the abdomen and pelvis was performed following the standard protocol without IV contrast. COMPARISON:  None. FINDINGS: Lower chest: No acute findings. Mild scarring/atelectasis at each lung base. Hepatobiliary: Status post cholecystectomy. No surgical complicating feature identified. No focal masses or lesions identified within the liver on this non enhanced exam. Pancreas: Unremarkable.  No mass or inflammatory change identified. Spleen: Within normal limits in size. Adrenals/Urinary Tract: Left adrenal mass measures 3.1 x 1.7 x 2.6 cm, consistent with benign lipid-rich adrenal adenoma based on CT density measurements. Right adrenal gland is unremarkable. Right kidney is surgically absent. No evidence of  local recurrent disease. Benign cyst within the upper pole of the left kidney anteriorly measures 1.5 x 1.2 cm. Hyperdense mass exophytic to the posterior cortex of the left kidney measures 1.3 x 1 cm, most likely a benign hemorrhagic cyst. Subtle 2.1 x 1.3 cm lobular mass exophytic to the lateral cortex of the lower left kidney is of uncertain significance and difficult to definitively characterize on this nonenhanced study. 1 mm nonobstructing left renal stone also noted. Stomach/Bowel: Stomach is unremarkable. There are no dilated large or small bowel loops. No bowel wall thickening or evidence of bowel wall inflammation seen. Moderate amount of stool and gas within the nondistended colon. Appendix is not convincingly seen but there are no inflammatory changes in the right lower quadrant to suggest acute appendicitis. Vascular/Lymphatic: Prominent atherosclerotic vascular calcifications are seen along the walls of the normal-caliber abdominal aorta and pelvic vasculature. Reproductive: Prostate gland is moderately enlarged causing slight mass effect on the bladder base. Otherwise unremarkable. Other: No free fluid or abscess collections seen. No free intraperitoneal air. No enlarged lymph nodes seen. Musculoskeletal: Degenerative changes throughout the thoracolumbar spine but no acute osseous abnormality. Incidental note made of a benign bone island within the  right femoral head. Superficial soft tissues are unremarkable. IMPRESSION: 1. Subtle nonspecific lobular mass exophytic to the lateral cortex of the lower left kidney (series 2, image 36; series 3, image 46), measuring 2.1 x 1.3 cm, difficult to characterize without intravascular contrast. Neoplastic mass cannot be excluded. Recommend further characterization with renal MRI. 2. Probably benign simple and hemorrhagic cysts within the upper left kidney. 3. 1 mm nonobstructing left renal stone. 4. Benign left adrenal adenoma. 5. Status post surgical resection  of the right kidney. No evidence of local residual or recurrent disease. No surgical complicating feature. 6. Status post cholecystectomy. 7. Moderate prostate gland enlargement causing slight mass effect on the bladder base. 8. Remainder of the abdomen and pelvis CT is unremarkable. No free fluid or inflammatory change. No bowel obstruction or evidence of bowel wall inflammation. No evidence of acute solid organ abnormality. Electronically Signed   By: Franki Cabot M.D.   On: 01/25/2015 13:36    Assessment & Plan:   Tonnie was seen today for hypertension and pain.  Diagnoses and all orders for this visit:  Renovascular hypertension  Right thigh pain -     DG Lumbar Spine 2-3 Views; Future  Right-sided low back pain without sciatica -     DG Lumbar Spine 2-3 Views; Future  Other orders -     gabapentin (NEURONTIN) 400 MG capsule; For right leg pain. One at night for 2 days. Then 2 for 2 days, then 4 at night. -     hydrALAZINE (APRESOLINE) 100 MG tablet; Take 1 tablet (100 mg total) by mouth 3 (three) times daily. For blood pressure   I have discontinued Mr. Mccalip's diclofenac. I have also changed his gabapentin and hydrALAZINE. Additionally, I am having him maintain his tamsulosin, omeprazole, lisinopril, amLODipine, and metoprolol tartrate.  Meds ordered this encounter  Medications  . gabapentin (NEURONTIN) 400 MG capsule    Sig: For right leg pain. One at night for 2 days. Then 2 for 2 days, then 4 at night.    Dispense:  120 capsule    Refill:  3  . hydrALAZINE (APRESOLINE) 100 MG tablet    Sig: Take 1 tablet (100 mg total) by mouth 3 (three) times daily. For blood pressure    Dispense:  60 tablet    Refill:  2     Follow-up: Return in about 3 months (around 06/28/2015), or if symptoms worsen or fail to improve.  Claretta Fraise, M.D.

## 2015-03-30 NOTE — Progress Notes (Signed)
PATIENT AWARE

## 2015-04-06 ENCOUNTER — Other Ambulatory Visit: Payer: Self-pay | Admitting: Family Medicine

## 2015-05-02 ENCOUNTER — Encounter: Payer: Self-pay | Admitting: Family Medicine

## 2015-05-02 ENCOUNTER — Ambulatory Visit (INDEPENDENT_AMBULATORY_CARE_PROVIDER_SITE_OTHER): Payer: Medicare HMO | Admitting: Family Medicine

## 2015-05-02 VITALS — BP 127/60 | HR 62 | Temp 97.2°F | Ht 65.0 in | Wt 136.4 lb

## 2015-05-02 DIAGNOSIS — M5416 Radiculopathy, lumbar region: Secondary | ICD-10-CM | POA: Diagnosis not present

## 2015-05-02 MED ORDER — GABAPENTIN 800 MG PO TABS: 1600.0000 mg | ORAL_TABLET | Freq: Two times a day (BID) | ORAL | Status: DC

## 2015-05-02 NOTE — Patient Instructions (Signed)
Remember to take gabapentin 1 in the morning and two in the evening of the new dose for the first week. Then start the full 2 capsules twice a day.

## 2015-05-02 NOTE — Progress Notes (Signed)
Subjective:  Patient ID: Jonathon Wang, male    DOB: 1950-07-29  Age: 65 y.o. MRN: [id number]  CC: Hyperlipidemia   HPI Jonathon Wang presents for  follow-up of hypertension. Patient has no history of headache chest pain or shortness of breath or recent cough. Patient also denies symptoms of TIA such as numbness weakness lateralizing. Patient checks  blood pressure at home and has not had any elevated readings recently. Patient denies side effects from his medication. States taking it regularly.  Moderately severe recurrent low back pain is radiating into the right anterior thigh. Pain onset remote. Pain in back is sharp at times with dull ache in between episodes. No relief, but no side effects with gabapentin   History Jonathon Wang has a past medical history of Hypertension; GERD (gastroesophageal reflux disease); CKD (chronic kidney disease); and Stroke ([place]).   He has past surgical history that includes Spine surgery; Rotator cuff repair (Right); and Cholecystectomy.   His family history includes Cancer (age of onset: 77) in his brother; Heart disease in his brother and father; Kidney disease in his brother and mother; Stroke in his father.He reports that he has been smoking Cigarettes.  He started smoking about 50 years ago. He has been smoking about 0.50 packs per day. He does not have any smokeless tobacco history on file. He reports that he drinks about 1.2 oz of alcohol per week. His drug history is not on file.  Current Outpatient Prescriptions on File Prior to Visit  Medication Sig Dispense Refill  . amLODipine (NORVASC) 10 MG tablet TAKE 1 TABLET EVERY DAY 30 tablet 5  . gabapentin (NEURONTIN) 400 MG capsule For right leg pain. One at night for 2 days. Then 2 for 2 days, then 4 at night. 120 capsule 3  . hydrALAZINE (APRESOLINE) 100 MG tablet Take 1 tablet (100 mg total) by mouth 3 (three) times daily. For blood pressure 60 tablet 2  . lisinopril (PRINIVIL,ZESTRIL) 40 MG tablet Take 1  tablet (40 mg total) by mouth daily. 90 tablet 1  . metoprolol tartrate (LOPRESSOR) 25 MG tablet TAKE 1/2 TABLET BY MOUTH TWICE DAILY 30 tablet 4  . omeprazole (PRILOSEC) 20 MG capsule TAKE 2 CAPSULES BY MOUTH EVERY DAY 60 capsule 5  . tamsulosin (FLOMAX) 0.4 MG CAPS capsule Take 2 capsules (0.8 mg total) by mouth daily. 60 capsule 5   No current facility-administered medications on file prior to visit.    ROS Review of Systems  Constitutional: Negative for fever, chills, diaphoresis and unexpected weight change.  HENT: Negative for congestion, hearing loss, rhinorrhea and sore throat.   Eyes: Negative for visual disturbance.  Respiratory: Negative for cough and shortness of breath.   Cardiovascular: Negative for chest pain.  Gastrointestinal: Negative for abdominal pain, diarrhea and constipation.  Genitourinary: Negative for dysuria and flank pain.  Musculoskeletal: Positive for myalgias and back pain. Negative for joint swelling and arthralgias.  Skin: Negative for rash.  Neurological: Negative for dizziness and headaches.  Psychiatric/Behavioral: Negative for sleep disturbance and dysphoric mood.    Objective:  BP 127/60 mmHg  Pulse 62  Temp(Src) 97.2 F (36.2 C) (Oral)  Ht 5\' 5"  (1.651 m)  Wt 136 lb 6.4 oz (61.871 kg)  BMI 22.70 kg/m2  SpO2 97%  BP Readings from Last 3 Encounters:  05/02/15 127/60  03/30/15 166/88  02/07/15 158/76    Wt Readings from Last 3 Encounters:  05/02/15 136 lb 6.4 oz (61.871 kg)  03/30/15 134 lb (60.782 kg)  02/07/15  137 lb 3.2 oz (62.234 kg)     Physical Exam  Constitutional: He is oriented to person, place, and time. He appears well-developed and well-nourished. No distress.  HENT:  Head: Normocephalic and atraumatic.  Right Ear: External ear normal.  Left Ear: External ear normal.  Nose: Nose normal.  Mouth/Throat: Oropharynx is clear and moist.  Eyes: Conjunctivae and EOM are normal. Pupils are equal, round, and reactive to  light.  Neck: Normal range of motion. Neck supple. No thyromegaly present.  Cardiovascular: Normal rate, regular rhythm and normal heart sounds.   No murmur heard. Pulmonary/Chest: Effort normal and breath sounds normal. No respiratory distress. He has no wheezes. He has no rales.  Abdominal: Soft. Bowel sounds are normal. He exhibits no distension. There is no tenderness.  Musculoskeletal: Normal range of motion. He exhibits tenderness (right lumbar region paraspinous spasm noted L5-S1. SLR positive. Saralyn Pilar neg. on right.).  Lymphadenopathy:    He has no cervical adenopathy.  Neurological: He is alert and oriented to person, place, and time. He has normal reflexes.  Skin: Skin is warm and dry.  Psychiatric: He has a normal mood and affect. His behavior is normal. Judgment and thought content normal.    No results found for: HGBA1C  Lab Results  Component Value Date   WBC 7.5 01/24/2015   HCT 39.8 01/24/2015   PLT 400* 01/24/2015   GLUCOSE 95 01/24/2015   CHOL 178 01/24/2015   TRIG 179* 01/24/2015   HDL 60 01/24/2015   LDLCALC 82 01/24/2015   ALT 10 01/24/2015   AST 19 01/24/2015   NA 142 01/24/2015   K 5.3* 01/24/2015   CL 103 01/24/2015   CREATININE 2.30* 01/25/2015   BUN 36* 01/24/2015   CO2 22 01/24/2015    Ct Abdomen Pelvis Wo Contrast  01/25/2015  CLINICAL DATA:  Right upper quadrant pain for 4 days. History of right kidney cancer 7 years ago. Creatinine 2.3. EXAM: CT ABDOMEN AND PELVIS WITHOUT CONTRAST TECHNIQUE: Multidetector CT imaging of the abdomen and pelvis was performed following the standard protocol without IV contrast. COMPARISON:  None. FINDINGS: Lower chest: No acute findings. Mild scarring/atelectasis at each lung base. Hepatobiliary: Status post cholecystectomy. No surgical complicating feature identified. No focal masses or lesions identified within the liver on this non enhanced exam. Pancreas: Unremarkable.  No mass or inflammatory change identified.  Spleen: Within normal limits in size. Adrenals/Urinary Tract: Left adrenal mass measures 3.1 x 1.7 x 2.6 cm, consistent with benign lipid-rich adrenal adenoma based on CT density measurements. Right adrenal gland is unremarkable. Right kidney is surgically absent. No evidence of local recurrent disease. Benign cyst within the upper pole of the left kidney anteriorly measures 1.5 x 1.2 cm. Hyperdense mass exophytic to the posterior cortex of the left kidney measures 1.3 x 1 cm, most likely a benign hemorrhagic cyst. Subtle 2.1 x 1.3 cm lobular mass exophytic to the lateral cortex of the lower left kidney is of uncertain significance and difficult to definitively characterize on this nonenhanced study. 1 mm nonobstructing left renal stone also noted. Stomach/Bowel: Stomach is unremarkable. There are no dilated large or small bowel loops. No bowel wall thickening or evidence of bowel wall inflammation seen. Moderate amount of stool and gas within the nondistended colon. Appendix is not convincingly seen but there are no inflammatory changes in the right lower quadrant to suggest acute appendicitis. Vascular/Lymphatic: Prominent atherosclerotic vascular calcifications are seen along the walls of the normal-caliber abdominal aorta and pelvic vasculature. Reproductive:  Prostate gland is moderately enlarged causing slight mass effect on the bladder base. Otherwise unremarkable. Other: No free fluid or abscess collections seen. No free intraperitoneal air. No enlarged lymph nodes seen. Musculoskeletal: Degenerative changes throughout the thoracolumbar spine but no acute osseous abnormality. Incidental note made of a benign bone island within the right femoral head. Superficial soft tissues are unremarkable. IMPRESSION: 1. Subtle nonspecific lobular mass exophytic to the lateral cortex of the lower left kidney (series 2, image 36; series 3, image 46), measuring 2.1 x 1.3 cm, difficult to characterize without intravascular  contrast. Neoplastic mass cannot be excluded. Recommend further characterization with renal MRI. 2. Probably benign simple and hemorrhagic cysts within the upper left kidney. 3. 1 mm nonobstructing left renal stone. 4. Benign left adrenal adenoma. 5. Status post surgical resection of the right kidney. No evidence of local residual or recurrent disease. No surgical complicating feature. 6. Status post cholecystectomy. 7. Moderate prostate gland enlargement causing slight mass effect on the bladder base. 8. Remainder of the abdomen and pelvis CT is unremarkable. No free fluid or inflammatory change. No bowel obstruction or evidence of bowel wall inflammation. No evidence of acute solid organ abnormality. Electronically Signed   By: Franki Cabot M.D.   On: 01/25/2015 13:36    Assessment & Plan:   There are no diagnoses linked to this encounter. I am having Mr. Vollrath maintain his tamsulosin, omeprazole, lisinopril, metoprolol tartrate, gabapentin, hydrALAZINE, and amLODipine.  No orders of the defined types were placed in this encounter.     Follow-up: No Follow-up on file.  Claretta Fraise, M.D.

## 2015-05-18 ENCOUNTER — Other Ambulatory Visit: Payer: Self-pay | Admitting: Family Medicine

## 2015-05-18 ENCOUNTER — Ambulatory Visit (HOSPITAL_COMMUNITY)
Admission: RE | Admit: 2015-05-18 | Discharge: 2015-05-18 | Disposition: A | Discharge status: Home / Self Care | Payer: Medicare HMO | Source: Ambulatory Visit | Attending: Family Medicine | Admitting: Family Medicine

## 2015-05-18 DIAGNOSIS — M47816 Spondylosis without myelopathy or radiculopathy, lumbar region: Secondary | ICD-10-CM | POA: Diagnosis not present

## 2015-05-18 DIAGNOSIS — M5126 Other intervertebral disc displacement, lumbar region: Secondary | ICD-10-CM

## 2015-05-18 DIAGNOSIS — M5136 Other intervertebral disc degeneration, lumbar region: Secondary | ICD-10-CM | POA: Insufficient documentation

## 2015-05-18 DIAGNOSIS — M5416 Radiculopathy, lumbar region: Secondary | ICD-10-CM | POA: Diagnosis not present

## 2015-05-23 ENCOUNTER — Ambulatory Visit (INDEPENDENT_AMBULATORY_CARE_PROVIDER_SITE_OTHER): Payer: Medicare HMO | Admitting: Family Medicine

## 2015-05-23 ENCOUNTER — Encounter: Payer: Self-pay | Admitting: Family Medicine

## 2015-05-23 VITALS — BP 143/73 | HR 90 | Temp 97.2°F | Ht 65.0 in | Wt 140.0 lb

## 2015-05-23 DIAGNOSIS — M5416 Radiculopathy, lumbar region: Secondary | ICD-10-CM

## 2015-05-23 DIAGNOSIS — M545 Low back pain, unspecified: Secondary | ICD-10-CM

## 2015-05-23 DIAGNOSIS — I150 Renovascular hypertension: Secondary | ICD-10-CM

## 2015-05-23 DIAGNOSIS — M48061 Spinal stenosis, lumbar region without neurogenic claudication: Secondary | ICD-10-CM

## 2015-05-23 DIAGNOSIS — M79651 Pain in right thigh: Secondary | ICD-10-CM

## 2015-05-23 DIAGNOSIS — M4806 Spinal stenosis, lumbar region: Secondary | ICD-10-CM | POA: Diagnosis not present

## 2015-05-23 MED ORDER — OXYCODONE HCL 15 MG PO TABS: 15.0000 mg | ORAL_TABLET | ORAL | Status: DC | PRN

## 2015-05-23 NOTE — Patient Instructions (Signed)
Use colace 100 mg twice daily to help prevent constipation as a side effect of the roxicodone pain pill

## 2015-05-23 NOTE — Progress Notes (Signed)
Subjective:  Patient ID: Jonathon Wang, male    DOB: 11-Nov-1950  Age: 65 y.o. MRN: [id number]  CC: Medication Refill   HPI Jonathon Wang presents for  Severe pain. Can't walk. MRI report reviewed with pt. Shows multiple level lumbar disc dx with herniation and effacement of nerve tissues. Additionally pt. States his pain prevents him from standing straight - can not extend back to neutral, has to remain flexed.   History Jonathon Wang has a past medical history of Hypertension; GERD (gastroesophageal reflux disease); CKD (chronic kidney disease); and Stroke ([place]).   Jonathon Wang has past surgical history that includes Spine surgery; Rotator cuff repair (Right); and Cholecystectomy.   His family history includes Cancer (age of onset: 32) in his brother; Heart disease in his brother and father; Kidney disease in his brother and mother; Stroke in his father.Jonathon Wang reports that Jonathon Wang has been smoking Cigarettes.  Jonathon Wang started smoking about 50 years ago. Jonathon Wang has been smoking about 0.50 packs per day. Jonathon Wang does not have any smokeless tobacco history on file. Jonathon Wang reports that Jonathon Wang drinks about 1.2 oz of alcohol per week. His drug history is not on file.    ROS Review of Systems  Constitutional: Negative for fever, chills and diaphoresis.  HENT: Negative for rhinorrhea and sore throat.   Gastrointestinal: Negative for abdominal pain.  Musculoskeletal: Positive for myalgias, back pain and arthralgias.  Skin: Negative for rash.  Neurological: Positive for weakness. Negative for headaches.    Objective:  BP 143/73 mmHg  Pulse 90  Temp(Src) 97.2 F (36.2 C) (Oral)  Ht 5\' 5"  (1.651 m)  Wt 140 lb (63.504 kg)  BMI 23.30 kg/m2  BP Readings from Last 3 Encounters:  05/23/15 143/73  05/02/15 127/60  03/30/15 166/88    Wt Readings from Last 3 Encounters:  05/23/15 140 lb (63.504 kg)  05/02/15 136 lb 6.4 oz (61.871 kg)  03/30/15 134 lb (60.782 kg)     Physical Exam  Constitutional: Jonathon Wang appears well-developed and  well-nourished.  HENT:  Head: Normocephalic and atraumatic.  Right Ear: Tympanic membrane normal. No decreased hearing is noted.  Left Ear: Tympanic membrane normal. No decreased hearing is noted.  Mouth/Throat: No posterior oropharyngeal erythema.  Neck: Normal range of motion. Neck supple.  Cardiovascular: Normal rate and regular rhythm.   No murmur heard. Pulmonary/Chest: Breath sounds normal. No respiratory distress.  Musculoskeletal: Jonathon Wang exhibits tenderness.  Pt. Flexed at 20-30 degrees at lumbar spine.Tender for spinal percussion.  Vitals reviewed.    Lab Results  Component Value Date   WBC 7.5 01/24/2015   HCT 39.8 01/24/2015   PLT 400* 01/24/2015   GLUCOSE 95 01/24/2015   CHOL 178 01/24/2015   TRIG 179* 01/24/2015   HDL 60 01/24/2015   LDLCALC 82 01/24/2015   ALT 10 01/24/2015   AST 19 01/24/2015   NA 142 01/24/2015   K 5.3* 01/24/2015   CL 103 01/24/2015   CREATININE 2.30* 01/25/2015   BUN 36* 01/24/2015   CO2 22 01/24/2015    Mr Lumbar Spine Wo Contrast  05/18/2015  CLINICAL DATA:  Low back pain radiating to both hips for 5 months. EXAM: MRI LUMBAR SPINE WITHOUT CONTRAST TECHNIQUE: Multiplanar, multisequence MR imaging of the lumbar spine was performed. No intravenous contrast was administered. COMPARISON:  03/30/2015 FINDINGS: The lowest lumbar type non-rib-bearing vertebra is labeled as L5. The conus medullaris appears normal. Conus level: L1. There is 3 mm degenerative retrolisthesis at L5-S1 with considerable loss of intervertebral disc height at this level  in addition to mild type 1 degenerative endplate findings. Disc desiccation is present at this level and also at L1-2 and L4-5. Potentially complex renal cysts on the left as shown on CT from 01/25/2015. Right nephrectomy. Additional findings at individual levels are as follows: T12-L1: No impingement. Mild disc bulge. This level is only included on the parasagittal images. L1-2: Moderate left subarticular lateral  recess stenosis with mild left foraminal stenosis and mild displacement of the left L1 nerve in the lateral extraforaminal space due to disc bulge, left lateral recess and foraminal disc protrusion, and mild facet arthropathy. Mild left eccentric central narrowing of the thecal sac. L2-3: Mild left and borderline right subarticular lateral recess stenosis and borderline central narrowing of the thecal sac due to short pedicles and mild disc bulge. L3-4: Prominent central narrowing of the thecal sac with mild left foraminal stenosis, moderate bilateral subarticular lateral recess stenosis, and mild displacement of both L3 nerves in the lateral extraforaminal space is due to disc bulge, facet arthropathy, and short pedicles. L4-5: Moderate central narrowing of the thecal sac with moderate left and mild right foraminal stenosis, mild to moderate left and mild right subarticular lateral recess stenosis, and mild displacement of both L4 nerves in the lateral extraforaminal space is due to disc bulge, short pedicles, facet arthropathy, and intervertebral spurring. There is a small synovial cyst posterior to the left facet joint and not in a position to cause impingement. L5-S1: Moderate left and mild to moderate right foraminal stenosis with potential mild right subarticular lateral recess stenosis due to intervertebral spurring, facet arthropathy, and suspected recurrent right lateral recess disc protrusion along with disc bulge. There has been prior right laminectomy and partial facetectomy at this level and accordingly some of the findings in the right lateral recess could be due to epidural fibrosis, but I favor recurrent disc protrusion given the posterior deviation of the S1 nerve roots. IMPRESSION: 1. Lumbar spondylosis and degenerative disc disease, causing prominent impingement at L3-4 ; moderate impingement at L1-2, L4-5, and L5-S1; and mild impingement at L2-3, as detailed above. 2. Small complex left renal  cystic lesions as shown on prior CT, technically nonspecific on MRI. Electronically Signed   By: Van Clines M.D.   On: 05/18/2015 11:47    Assessment & Plan:   Jonathon Wang was seen today for medication refill.  Diagnoses and all orders for this visit:  Lumbar radiculopathy  Renovascular hypertension  Right thigh pain  Right-sided low back pain without sciatica  Spinal stenosis of lumbar region  Other orders -     Discontinue: oxyCODONE (ROXICODONE) 15 MG immediate release tablet; Take 1 tablet (15 mg total) by mouth every 4 (four) hours as needed for pain. -     oxyCODONE (ROXICODONE) 15 MG immediate release tablet; Take 1 tablet (15 mg total) by mouth every 4 (four) hours as needed for pain.      I am having Jonathon Wang maintain his tamsulosin, omeprazole, lisinopril, metoprolol tartrate, hydrALAZINE, amLODipine, gabapentin, and oxyCODONE.  Meds ordered this encounter  Medications  . DISCONTD: oxyCODONE (ROXICODONE) 15 MG immediate release tablet    Sig: Take 1 tablet (15 mg total) by mouth every 4 (four) hours as needed for pain.    Dispense:  30 tablet    Refill:  0  . oxyCODONE (ROXICODONE) 15 MG immediate release tablet    Sig: Take 1 tablet (15 mg total) by mouth every 4 (four) hours as needed for pain.    Dispense:  60 tablet    Refill:  0     Follow-up: Return in about 2 weeks (around 06/06/2015).  Claretta Fraise, M.D.

## 2015-06-13 ENCOUNTER — Ambulatory Visit: Payer: Medicare HMO | Admitting: Family Medicine

## 2015-06-22 ENCOUNTER — Telehealth: Payer: Self-pay | Admitting: Family Medicine

## 2015-06-22 NOTE — Telephone Encounter (Signed)
appt made

## 2015-06-24 ENCOUNTER — Encounter: Payer: Self-pay | Admitting: Family Medicine

## 2015-06-24 ENCOUNTER — Ambulatory Visit (INDEPENDENT_AMBULATORY_CARE_PROVIDER_SITE_OTHER): Payer: Medicare HMO | Admitting: Family Medicine

## 2015-06-24 VITALS — BP 144/74 | HR 60 | Temp 97.0°F | Ht 65.0 in | Wt 142.2 lb

## 2015-06-24 DIAGNOSIS — M5416 Radiculopathy, lumbar region: Secondary | ICD-10-CM | POA: Diagnosis not present

## 2015-06-24 DIAGNOSIS — M4806 Spinal stenosis, lumbar region: Secondary | ICD-10-CM | POA: Diagnosis not present

## 2015-06-24 DIAGNOSIS — M79651 Pain in right thigh: Secondary | ICD-10-CM

## 2015-06-24 DIAGNOSIS — M48061 Spinal stenosis, lumbar region without neurogenic claudication: Secondary | ICD-10-CM

## 2015-06-24 MED ORDER — OXYCODONE HCL 15 MG PO TABS: 15.0000 mg | ORAL_TABLET | ORAL | Status: DC | PRN

## 2015-06-24 NOTE — Progress Notes (Signed)
Subjective:  Patient ID: Jonathon Wang, male    DOB: 19-Jun-1950  Age: 65 y.o. MRN: [id number]  CC: Back Pain   HPI Jonathon Wang presents for  Severe pain. Can't walk. MRI reporthad multiple level lumbar disc dx with herniation and effacement of nerve tissues. Neurosurgery referral in the works.  His pain still prevents him from standing straight - can not extend back to neutral, has to remain flexed. Pain 8-10/10 Some relief laying down. Good relief with oxy codone, but causes drowsiness so cutting in half at times.   History Jonathon Wang has a past medical history of Hypertension; GERD (gastroesophageal reflux disease); CKD (chronic kidney disease); and Stroke ([place]).   Jonathon Wang has past surgical history that includes Spine surgery; Rotator cuff repair (Right); and Cholecystectomy.   His family history includes Cancer (age of onset: 52) in his brother; Heart disease in his brother and father; Kidney disease in his brother and mother; Stroke in his father.Jonathon Wang reports that Jonathon Wang has been smoking Cigarettes.  Jonathon Wang started smoking about 50 years ago. Jonathon Wang has been smoking about 0.50 packs per day. Jonathon Wang does not have any smokeless tobacco history on file. Jonathon Wang reports that Jonathon Wang drinks about 1.2 oz of alcohol per week. His drug history is not on file.    ROS Review of Systems  Constitutional: Negative for fever, chills and diaphoresis.  HENT: Negative for rhinorrhea and sore throat.   Gastrointestinal: Negative for abdominal pain.  Musculoskeletal: Positive for myalgias, back pain and arthralgias.  Skin: Negative for rash.  Neurological: Positive for weakness. Negative for headaches.    Objective:  BP 144/74 mmHg  Pulse 60  Temp(Src) 97 F (36.1 C) (Oral)  Ht 5\' 5"  (1.651 m)  Wt 142 lb 3.2 oz (64.501 kg)  BMI 23.66 kg/m2  SpO2 95%  BP Readings from Last 3 Encounters:  06/24/15 144/74  05/23/15 143/73  05/02/15 127/60    Wt Readings from Last 3 Encounters:  06/24/15 142 lb 3.2 oz (64.501 kg)    05/23/15 140 lb (63.504 kg)  05/02/15 136 lb 6.4 oz (61.871 kg)     Physical Exam  Constitutional: Jonathon Wang appears well-developed and well-nourished.  HENT:  Head: Normocephalic and atraumatic.  Right Ear: Tympanic membrane normal. No decreased hearing is noted.  Left Ear: Tympanic membrane normal. No decreased hearing is noted.  Mouth/Throat: No posterior oropharyngeal erythema.  Neck: Normal range of motion. Neck supple.  Cardiovascular: Normal rate and regular rhythm.   No murmur heard. Pulmonary/Chest: Breath sounds normal. No respiratory distress.  Musculoskeletal: Jonathon Wang exhibits tenderness.  Pt. Flexed at 20-30 degrees at lumbar spine.Tender for spinal percussion.  Vitals reviewed.     Lab Results  Component Value Date   WBC 7.5 01/24/2015   HCT 39.8 01/24/2015   PLT 400* 01/24/2015   GLUCOSE 95 01/24/2015   CHOL 178 01/24/2015   TRIG 179* 01/24/2015   HDL 60 01/24/2015   LDLCALC 82 01/24/2015   ALT 10 01/24/2015   AST 19 01/24/2015   NA 142 01/24/2015   K 5.3* 01/24/2015   CL 103 01/24/2015   CREATININE 2.30* 01/25/2015   BUN 36* 01/24/2015   CO2 22 01/24/2015    Mr Lumbar Spine Wo Contrast  05/18/2015  CLINICAL DATA:  Low back pain radiating to both hips for 5 months. EXAM: MRI LUMBAR SPINE WITHOUT CONTRAST TECHNIQUE: Multiplanar, multisequence MR imaging of the lumbar spine was performed. No intravenous contrast was administered. COMPARISON:  03/30/2015 FINDINGS: The lowest lumbar type non-rib-bearing vertebra is  labeled as L5. The conus medullaris appears normal. Conus level: L1. There is 3 mm degenerative retrolisthesis at L5-S1 with considerable loss of intervertebral disc height at this level in addition to mild type 1 degenerative endplate findings. Disc desiccation is present at this level and also at L1-2 and L4-5. Potentially complex renal cysts on the left as shown on CT from 01/25/2015. Right nephrectomy. Additional findings at individual levels are as follows:  T12-L1: No impingement. Mild disc bulge. This level is only included on the parasagittal images. L1-2: Moderate left subarticular lateral recess stenosis with mild left foraminal stenosis and mild displacement of the left L1 nerve in the lateral extraforaminal space due to disc bulge, left lateral recess and foraminal disc protrusion, and mild facet arthropathy. Mild left eccentric central narrowing of the thecal sac. L2-3: Mild left and borderline right subarticular lateral recess stenosis and borderline central narrowing of the thecal sac due to short pedicles and mild disc bulge. L3-4: Prominent central narrowing of the thecal sac with mild left foraminal stenosis, moderate bilateral subarticular lateral recess stenosis, and mild displacement of both L3 nerves in the lateral extraforaminal space is due to disc bulge, facet arthropathy, and short pedicles. L4-5: Moderate central narrowing of the thecal sac with moderate left and mild right foraminal stenosis, mild to moderate left and mild right subarticular lateral recess stenosis, and mild displacement of both L4 nerves in the lateral extraforaminal space is due to disc bulge, short pedicles, facet arthropathy, and intervertebral spurring. There is a small synovial cyst posterior to the left facet joint and not in a position to cause impingement. L5-S1: Moderate left and mild to moderate right foraminal stenosis with potential mild right subarticular lateral recess stenosis due to intervertebral spurring, facet arthropathy, and suspected recurrent right lateral recess disc protrusion along with disc bulge. There has been prior right laminectomy and partial facetectomy at this level and accordingly some of the findings in the right lateral recess could be due to epidural fibrosis, but I favor recurrent disc protrusion given the posterior deviation of the S1 nerve roots. IMPRESSION: 1. Lumbar spondylosis and degenerative disc disease, causing prominent impingement  at L3-4 ; moderate impingement at L1-2, L4-5, and L5-S1; and mild impingement at L2-3, as detailed above. 2. Small complex left renal cystic lesions as shown on prior CT, technically nonspecific on MRI. Electronically Signed   By: Van Clines M.D.   On: 05/18/2015 11:47    Assessment & Plan:   Jonathon Wang was seen today for back pain.  Diagnoses and all orders for this visit:  Lumbar radiculopathy  Spinal stenosis of lumbar region  Right thigh pain  Other orders -     oxyCODONE (ROXICODONE) 15 MG immediate release tablet; Take 1 tablet (15 mg total) by mouth every 4 (four) hours as needed for pain.     I am having Jonathon Wang maintain his tamsulosin, omeprazole, lisinopril, metoprolol tartrate, hydrALAZINE, amLODipine, gabapentin, and oxyCODONE.  Meds ordered this encounter  Medications  . oxyCODONE (ROXICODONE) 15 MG immediate release tablet    Sig: Take 1 tablet (15 mg total) by mouth every 4 (four) hours as needed for pain.    Dispense:  60 tablet    Refill:  0     Follow-up: Return in about 1 month (around 07/24/2015) for Pain.  Claretta Fraise, M.D.

## 2015-06-24 NOTE — Addendum Note (Signed)
Addended by: Marin Olp on: 06/24/2015 11:23 AM   Modules accepted: Orders

## 2015-06-25 ENCOUNTER — Other Ambulatory Visit: Payer: Self-pay | Admitting: Family Medicine

## 2015-07-05 ENCOUNTER — Telehealth: Payer: Self-pay | Admitting: Family Medicine

## 2015-07-06 ENCOUNTER — Telehealth: Payer: Self-pay | Admitting: Family Medicine

## 2015-07-06 MED ORDER — LISINOPRIL 40 MG PO TABS: 40.0000 mg | ORAL_TABLET | Freq: Every day | ORAL | Status: DC

## 2015-07-06 NOTE — Telephone Encounter (Signed)
rx sent for a month per request

## 2015-07-07 NOTE — Telephone Encounter (Signed)
Done on 07/06/15

## 2015-07-08 ENCOUNTER — Other Ambulatory Visit: Payer: Medicare HMO

## 2015-07-08 ENCOUNTER — Other Ambulatory Visit: Payer: Self-pay

## 2015-07-09 LAB — CBC WITH DIFFERENTIAL/PLATELET
BASOS ABS: 0.1 10*3/uL (ref 0.0–0.2)
BASOS: 1 %
EOS (ABSOLUTE): 0.1 10*3/uL (ref 0.0–0.4)
Eos: 2 %
Hematocrit: 37.1 % — ABNORMAL LOW (ref 37.5–51.0)
Hemoglobin: 11.9 g/dL — ABNORMAL LOW (ref 12.6–17.7)
IMMATURE GRANS (ABS): 0 10*3/uL (ref 0.0–0.1)
IMMATURE GRANULOCYTES: 0 %
LYMPHS: 29 %
Lymphocytes Absolute: 1.9 10*3/uL (ref 0.7–3.1)
MCH: 29.9 pg (ref 26.6–33.0)
MCHC: 32.1 g/dL (ref 31.5–35.7)
MCV: 93 fL (ref 79–97)
Monocytes Absolute: 0.7 10*3/uL (ref 0.1–0.9)
Monocytes: 11 %
NEUTROS PCT: 57 %
Neutrophils Absolute: 3.9 10*3/uL (ref 1.4–7.0)
PLATELETS: 374 10*3/uL (ref 150–379)
RBC: 3.98 x10E6/uL — ABNORMAL LOW (ref 4.14–5.80)
RDW: 14.6 % (ref 12.3–15.4)
WBC: 6.7 10*3/uL (ref 3.4–10.8)

## 2015-07-09 LAB — BMP8+EGFR
BUN/Creatinine Ratio: 20 (ref 10–24)
BUN: 32 mg/dL — ABNORMAL HIGH (ref 8–27)
CALCIUM: 9.6 mg/dL (ref 8.6–10.2)
CHLORIDE: 100 mmol/L (ref 96–106)
CO2: 24 mmol/L (ref 18–29)
Creatinine, Ser: 1.64 mg/dL — ABNORMAL HIGH (ref 0.76–1.27)
GFR calc Af Amer: 50 mL/min/{1.73_m2} — ABNORMAL LOW (ref >59)
GFR, EST NON AFRICAN AMERICAN: 43 mL/min/{1.73_m2} — AB (ref >59)
Glucose: 125 mg/dL — ABNORMAL HIGH (ref 65–99)
POTASSIUM: 5.4 mmol/L — AB (ref 3.5–5.2)
Sodium: 139 mmol/L (ref 134–144)

## 2015-07-28 ENCOUNTER — Encounter (INDEPENDENT_AMBULATORY_CARE_PROVIDER_SITE_OTHER): Payer: Self-pay

## 2015-07-29 ENCOUNTER — Ambulatory Visit (INDEPENDENT_AMBULATORY_CARE_PROVIDER_SITE_OTHER): Payer: Medicare HMO | Admitting: Family Medicine

## 2015-07-29 ENCOUNTER — Encounter: Payer: Self-pay | Admitting: Family Medicine

## 2015-07-29 VITALS — BP 157/83 | HR 74 | Temp 97.4°F | Ht 65.0 in | Wt 135.6 lb

## 2015-07-29 DIAGNOSIS — I150 Renovascular hypertension: Secondary | ICD-10-CM | POA: Diagnosis not present

## 2015-07-29 DIAGNOSIS — M5416 Radiculopathy, lumbar region: Secondary | ICD-10-CM | POA: Diagnosis not present

## 2015-07-29 DIAGNOSIS — M79651 Pain in right thigh: Secondary | ICD-10-CM

## 2015-07-29 NOTE — Progress Notes (Signed)
Subjective:  Patient ID: Jonathon Wang, male    DOB: 05-Aug-1950  Age: 65 y.o. MRN: [id number]  CC: Hypertension and Pain   HPI Jonathon Wang presents for surgery follow up. Felt like new until yesterday.   Severe pain with sleeping on the couch instead of in his bed yesterday.. His pain no longer prevents him from standing straight. Pain 8-10/10 after sleeping on the couch but was 0 before. Getting better already. Some relief laying down. Good relief with oxy codone, but causes drowsiness so cutting in half at times.   follow-up of hypertension. Patient has no history of headache chest pain or shortness of breath or recent cough. Patient also denies symptoms of TIA such as numbness weakness lateralizing. Patient checks  blood pressure at home and has not had any elevated readings recently. Patient denies side effects from his medication. States taking it regularly.    History Jonathon Wang has a past medical history of Hypertension; GERD (gastroesophageal reflux disease); CKD (chronic kidney disease); and Stroke ([place]).   He has past surgical history that includes Spine surgery; Rotator cuff repair (Right); and Cholecystectomy.   His family history includes Cancer (age of onset: 44) in his brother; Heart disease in his brother and father; Kidney disease in his brother and mother; Stroke in his father.He reports that he has been smoking Cigarettes.  He started smoking about 50 years ago. He has been smoking about 0.50 packs per day. He does not have any smokeless tobacco history on file. He reports that he drinks about 1.2 oz of alcohol per week. His drug history is not on file.    ROS Review of Systems  Constitutional: Negative for fever, chills and diaphoresis.  HENT: Negative for rhinorrhea and sore throat.   Gastrointestinal: Negative for abdominal pain.  Musculoskeletal: Positive for myalgias, back pain and arthralgias.  Skin: Negative for rash.  Neurological: Positive for weakness.  Negative for headaches.    Objective:  BP 157/83 mmHg  Pulse 74  Temp(Src) 97.4 F (36.3 C) (Oral)  Ht 5\' 5"  (1.651 m)  Wt 135 lb 9.6 oz (61.508 kg)  BMI 22.57 kg/m2  SpO2 95%  BP Readings from Last 3 Encounters:  07/29/15 157/83  06/24/15 144/74  05/23/15 143/73    Wt Readings from Last 3 Encounters:  07/29/15 135 lb 9.6 oz (61.508 kg)  06/24/15 142 lb 3.2 oz (64.501 kg)  05/23/15 140 lb (63.504 kg)     Physical Exam  Constitutional: He appears well-developed and well-nourished.  HENT:  Head: Normocephalic and atraumatic.  Right Ear: Tympanic membrane normal. No decreased hearing is noted.  Left Ear: Tympanic membrane normal. No decreased hearing is noted.  Mouth/Throat: No posterior oropharyngeal erythema.  Neck: Normal range of motion. Neck supple.  Cardiovascular: Normal rate and regular rhythm.   No murmur heard. Pulmonary/Chest: Breath sounds normal. No respiratory distress.  Musculoskeletal: He exhibits tenderness.  Pt. Flexed at 20-30 degrees at lumbar spine.Tender for spinal percussion.  Vitals reviewed.     Lab Results  Component Value Date   WBC 6.7 07/08/2015   HCT 37.1* 07/08/2015   PLT 374 07/08/2015   GLUCOSE 125* 07/08/2015   CHOL 178 01/24/2015   TRIG 179* 01/24/2015   HDL 60 01/24/2015   LDLCALC 82 01/24/2015   ALT 10 01/24/2015   AST 19 01/24/2015   NA 139 07/08/2015   K 5.4* 07/08/2015   CL 100 07/08/2015   CREATININE 1.64* 07/08/2015   BUN 32* 07/08/2015   CO2 24  07/08/2015    Mr Lumbar Spine Wo Contrast  05/18/2015  CLINICAL DATA:  Low back pain radiating to both hips for 5 months. EXAM: MRI LUMBAR SPINE WITHOUT CONTRAST TECHNIQUE: Multiplanar, multisequence MR imaging of the lumbar spine was performed. No intravenous contrast was administered. COMPARISON:  03/30/2015 FINDINGS: The lowest lumbar type non-rib-bearing vertebra is labeled as L5. The conus medullaris appears normal. Conus level: L1. There is 3 mm degenerative  retrolisthesis at L5-S1 with considerable loss of intervertebral disc height at this level in addition to mild type 1 degenerative endplate findings. Disc desiccation is present at this level and also at L1-2 and L4-5. Potentially complex renal cysts on the left as shown on CT from 01/25/2015. Right nephrectomy. Additional findings at individual levels are as follows: T12-L1: No impingement. Mild disc bulge. This level is only included on the parasagittal images. L1-2: Moderate left subarticular lateral recess stenosis with mild left foraminal stenosis and mild displacement of the left L1 nerve in the lateral extraforaminal space due to disc bulge, left lateral recess and foraminal disc protrusion, and mild facet arthropathy. Mild left eccentric central narrowing of the thecal sac. L2-3: Mild left and borderline right subarticular lateral recess stenosis and borderline central narrowing of the thecal sac due to short pedicles and mild disc bulge. L3-4: Prominent central narrowing of the thecal sac with mild left foraminal stenosis, moderate bilateral subarticular lateral recess stenosis, and mild displacement of both L3 nerves in the lateral extraforaminal space is due to disc bulge, facet arthropathy, and short pedicles. L4-5: Moderate central narrowing of the thecal sac with moderate left and mild right foraminal stenosis, mild to moderate left and mild right subarticular lateral recess stenosis, and mild displacement of both L4 nerves in the lateral extraforaminal space is due to disc bulge, short pedicles, facet arthropathy, and intervertebral spurring. There is a small synovial cyst posterior to the left facet joint and not in a position to cause impingement. L5-S1: Moderate left and mild to moderate right foraminal stenosis with potential mild right subarticular lateral recess stenosis due to intervertebral spurring, facet arthropathy, and suspected recurrent right lateral recess disc protrusion along with  disc bulge. There has been prior right laminectomy and partial facetectomy at this level and accordingly some of the findings in the right lateral recess could be due to epidural fibrosis, but I favor recurrent disc protrusion given the posterior deviation of the S1 nerve roots. IMPRESSION: 1. Lumbar spondylosis and degenerative disc disease, causing prominent impingement at L3-4 ; moderate impingement at L1-2, L4-5, and L5-S1; and mild impingement at L2-3, as detailed above. 2. Small complex left renal cystic lesions as shown on prior CT, technically nonspecific on MRI. Electronically Signed   By: Van Clines M.D.   On: 05/18/2015 11:47    Assessment & Plan:   Jyair was seen today for hypertension and pain.  Diagnoses and all orders for this visit:  Right thigh pain  Lumbar radiculopathy  Renovascular hypertension   Exercise regimen reviewed. Activities that are acceptable and that should be avoided or discussed in detail related to his recent back surgery.   Decrease gabapentin as follows:  Take 1 & 1/2 each morning and 2 in the evening for 3 days  ThenTake 1 & 1/2 each morning and 1& 1/2  in the evening for 3 days  ThenTake 1  each morning and 1 & 1/2 in the evening for 3 days  ThenTake 1 each morning and 1 in the evening for 3 days  Then take 1/2 each morning and 1 in the evening for 3 days  Then take 1/2 twice a day for 3 days.  Then take 1/2 each evening for 3 days.  Then DC med entirely.  If at any point you begin to feel the pain rebound stop decreasing the medication until the pain goes away.  I am having Jonathon Wang maintain his tamsulosin, omeprazole, metoprolol tartrate, amLODipine, gabapentin, oxyCODONE, hydrALAZINE, and lisinopril.  No orders of the defined types were placed in this encounter.     Follow-up: Return in about 3 months (around 10/29/2015).  Claretta Fraise, M.D.

## 2015-07-29 NOTE — Patient Instructions (Signed)
Decrease gabapentin as follows:  Take 1 & 1/2 each morning and 2 in the evening for 3 days  ThenTake 1 & 1/2 each morning and 1& 1/2  in the evening for 3 days  ThenTake 1  each morning and 1 & 1/2 in the evening for 3 days  ThenTake 1 each morning and 1 in the evening for 3 days  Then take 1/2 each morning and 1 in the evening for 3 days  Then take 1/2 twice a day for 3 days.  Then take 1/2 each evening for 3 days.  Then DC med entirely.  If at any point you begin to feel the pain rebound stop decreasing the medication until the pain goes away.

## 2015-08-02 ENCOUNTER — Other Ambulatory Visit: Payer: Self-pay | Admitting: Family Medicine

## 2015-09-06 ENCOUNTER — Other Ambulatory Visit: Payer: Self-pay | Admitting: Family Medicine

## 2015-10-05 ENCOUNTER — Other Ambulatory Visit: Payer: Self-pay | Admitting: Family Medicine

## 2015-10-05 MED ORDER — AMLODIPINE BESYLATE 10 MG PO TABS: 10.0000 mg | ORAL_TABLET | Freq: Every day | ORAL | Status: DC

## 2015-10-05 NOTE — Telephone Encounter (Signed)
done

## 2015-10-25 ENCOUNTER — Other Ambulatory Visit: Payer: Self-pay | Admitting: *Deleted

## 2015-10-25 MED ORDER — LISINOPRIL 40 MG PO TABS: 40.0000 mg | ORAL_TABLET | Freq: Every day | ORAL | 0 refills | Status: AC

## 2015-12-02 ENCOUNTER — Ambulatory Visit (INDEPENDENT_AMBULATORY_CARE_PROVIDER_SITE_OTHER): Payer: Medicare HMO | Admitting: Family Medicine

## 2015-12-02 ENCOUNTER — Encounter: Payer: Self-pay | Admitting: Family Medicine

## 2015-12-02 VITALS — BP 148/62 | HR 62 | Temp 97.7°F | Ht 65.0 in | Wt 141.2 lb

## 2015-12-02 DIAGNOSIS — M4806 Spinal stenosis, lumbar region: Secondary | ICD-10-CM | POA: Diagnosis not present

## 2015-12-02 DIAGNOSIS — I150 Renovascular hypertension: Secondary | ICD-10-CM | POA: Diagnosis not present

## 2015-12-02 DIAGNOSIS — M48061 Spinal stenosis, lumbar region without neurogenic claudication: Secondary | ICD-10-CM

## 2015-12-02 MED ORDER — FLUTICASONE FUROATE-VILANTEROL 200-25 MCG/INH IN AEPB: 1.0000 | INHALATION_SPRAY | Freq: Every day | RESPIRATORY_TRACT | 5 refills | Status: DC

## 2015-12-02 NOTE — Progress Notes (Signed)
Subjective:  Patient ID: Jonathon Wang, male    DOB: 1950-08-30  Age: 65 y.o. MRN: [id number]  CC: 3 month follow up (pt here for routine follow up for HTN)   HPI Jonathon Wang presents for right flank pain. DOE. Hx of right nephrectomy.          follow-up of hypertension. Patient has no history of headache chest pain or shortness of breath or recent cough. Patient also denies symptoms of TIA such as numbness weakness lateralizing. Patient checks  blood pressure at home and has not had any elevated readings recently. Patient denies side effects from his medication. States taking it regularly.                  History Jonathon Wang has a past medical history of CKD (chronic kidney disease); GERD (gastroesophageal reflux disease); Hypertension; and Stroke [facility]).   He has a past surgical history that includes Spine surgery; Rotator cuff repair (Right); and Cholecystectomy.   His family history includes Cancer (age of onset: 24) in his brother; Heart disease in his brother and father; Kidney disease in his brother and mother; Stroke in his father.He reports that he has been smoking Cigarettes.  He started smoking about 50 years ago. He has been smoking about 0.50 packs per day. He has never used smokeless tobacco. He reports that he drinks about 1.2 oz of alcohol per week . His drug history is not on file.    ROS Review of Systems  Constitutional: Negative for chills, diaphoresis, fever and unexpected weight change.  HENT: Negative for congestion, hearing loss, rhinorrhea and sore throat.   Eyes: Negative for visual disturbance.  Respiratory: Negative for cough and shortness of breath.   Cardiovascular: Negative for chest pain.  Gastrointestinal: Negative for abdominal pain, constipation and diarrhea.  Genitourinary: Positive for flank pain. Negative for dysuria.  Musculoskeletal: Negative for arthralgias and joint swelling.  Skin: Negative for rash.  Neurological: Negative for dizziness and  headaches.  Psychiatric/Behavioral: Negative for dysphoric mood and sleep disturbance.    Objective:  BP (!) 148/62   Pulse 62   Temp 97.7 F (36.5 C) (Oral)   Ht 5\' 5"  (1.651 m)   Wt 141 lb 4 oz (64.1 kg)   BMI 23.51 kg/m   BP Readings from Last 3 Encounters:  12/02/15 (!) 148/62  07/29/15 (!) 157/83  06/24/15 (!) 144/74    Wt Readings from Last 3 Encounters:  12/02/15 141 lb 4 oz (64.1 kg)  07/29/15 135 lb 9.6 oz (61.5 kg)  06/24/15 142 lb 3.2 oz (64.5 kg)     Physical Exam  Constitutional: He is oriented to person, place, and time. He appears well-developed and well-nourished. No distress.  HENT:  Head: Normocephalic and atraumatic.  Right Ear: External ear normal.  Left Ear: External ear normal.  Nose: Nose normal.  Mouth/Throat: Oropharynx is clear and moist.  Eyes: Conjunctivae and EOM are normal. Pupils are equal, round, and reactive to light.  Neck: Normal range of motion. Neck supple. No thyromegaly present.  Cardiovascular: Normal rate, regular rhythm and normal heart sounds.   No murmur heard. Pulmonary/Chest: Effort normal and breath sounds normal. No respiratory distress. He has no wheezes. He has no rales.  Abdominal: Soft. Bowel sounds are normal. He exhibits no distension. There is no tenderness.  Lymphadenopathy:    He has no cervical adenopathy.  Neurological: He is alert and oriented to person, place, and time. He has normal reflexes.  Skin: Skin is  warm and dry.  Psychiatric: He has a normal mood and affect. His behavior is normal. Judgment and thought content normal.     Lab Results  Component Value Date   WBC 6.7 07/08/2015   HCT 37.1 (L) 07/08/2015   PLT 374 07/08/2015   GLUCOSE 125 (H) 07/08/2015   CHOL 178 01/24/2015   TRIG 179 (H) 01/24/2015   HDL 60 01/24/2015   LDLCALC 82 01/24/2015   ALT 10 01/24/2015   AST 19 01/24/2015   NA 139 07/08/2015   K 5.4 (H) 07/08/2015   CL 100 07/08/2015   CREATININE 1.64 (H) 07/08/2015   BUN 32  (H) 07/08/2015   CO2 24 07/08/2015      Assessment & Plan:   Jonathon Wang was seen today for 3 month follow up.  Diagnoses and all orders for this visit:  Renovascular hypertension  Spinal stenosis of lumbar region  Other orders -     fluticasone furoate-vilanterol (BREO ELLIPTA) 200-25 MCG/INH AEPB; Inhale 1 puff into the lungs daily.    I have discontinued Mr. Armando oxyCODONE, hydrALAZINE, and hydrALAZINE. I am also having him start on fluticasone furoate-vilanterol. Additionally, I am having him maintain his metoprolol tartrate, amLODipine, and lisinopril.  Meds ordered this encounter  Medications  . DISCONTD: hydrALAZINE (APRESOLINE) 100 MG tablet    Sig: Take 100 mg by mouth 2 (two) times daily.  . fluticasone furoate-vilanterol (BREO ELLIPTA) 200-25 MCG/INH AEPB    Sig: Inhale 1 puff into the lungs daily.    Dispense:  1 each    Refill:  5     Follow-up: Return in about 3 months (around 03/02/2016).  Claretta Fraise, M.D.

## 2015-12-06 ENCOUNTER — Other Ambulatory Visit: Payer: Self-pay

## 2015-12-06 ENCOUNTER — Other Ambulatory Visit: Payer: Self-pay | Admitting: Family Medicine

## 2015-12-06 MED ORDER — HYDRALAZINE HCL 100 MG PO TABS
100.0000 mg | ORAL_TABLET | Freq: Two times a day (BID) | ORAL | 0 refills | Status: DC
Start: 2015-12-06 — End: 2016-04-05

## 2015-12-09 ENCOUNTER — Encounter: Payer: Self-pay | Admitting: Family Medicine

## 2016-01-05 ENCOUNTER — Telehealth: Payer: Self-pay | Admitting: Family Medicine

## 2016-01-06 ENCOUNTER — Other Ambulatory Visit: Payer: Self-pay | Admitting: Family Medicine

## 2016-01-06 MED ORDER — BUDESONIDE-FORMOTEROL FUMARATE 160-4.5 MCG/ACT IN AERO: 2.0000 | INHALATION_SPRAY | Freq: Two times a day (BID) | RESPIRATORY_TRACT | 3 refills | Status: DC

## 2016-01-06 NOTE — Telephone Encounter (Signed)
I sent in the requested prescription 

## 2016-01-06 NOTE — Telephone Encounter (Signed)
Left detailed message stating a different rx has been sent into the pharmacy and to call us back with any further questions or concerns.

## 2016-01-10 ENCOUNTER — Telehealth: Payer: Self-pay

## 2016-01-10 NOTE — Telephone Encounter (Signed)
Use COPD

## 2016-01-12 ENCOUNTER — Telehealth: Payer: Self-pay | Admitting: Family Medicine

## 2016-01-12 NOTE — Telephone Encounter (Signed)
Doing prior authorization now

## 2016-01-17 ENCOUNTER — Telehealth: Payer: Self-pay

## 2016-01-17 MED ORDER — BUDESONIDE-FORMOTEROL FUMARATE 160-4.5 MCG/ACT IN AERO: 2.0000 | INHALATION_SPRAY | Freq: Two times a day (BID) | RESPIRATORY_TRACT | 3 refills | Status: DC

## 2016-01-17 NOTE — Telephone Encounter (Signed)
Please contact the patient I meant for 3 inhalers to be a three month supply. Will they cover three mos scrip?

## 2016-01-19 ENCOUNTER — Telehealth: Payer: Self-pay

## 2016-01-19 ENCOUNTER — Telehealth: Payer: Self-pay | Admitting: Family Medicine

## 2016-01-19 NOTE — Telephone Encounter (Signed)
Any ideas what drug?

## 2016-01-20 ENCOUNTER — Other Ambulatory Visit: Payer: Self-pay | Admitting: Family Medicine

## 2016-01-20 ENCOUNTER — Telehealth: Payer: Self-pay | Admitting: Family Medicine

## 2016-01-20 MED ORDER — BECLOMETHASONE DIPROPIONATE 80 MCG/ACT IN AERS: 2.0000 | INHALATION_SPRAY | Freq: Two times a day (BID) | RESPIRATORY_TRACT | 12 refills | Status: DC

## 2016-01-20 NOTE — Telephone Encounter (Signed)
Did he check after the prior auth  For symbicort was completed ( 2 days ago)?

## 2016-01-20 NOTE — Telephone Encounter (Signed)
I can't predict cost, but will try QVar, it has been inexpensive in the past.

## 2016-01-20 NOTE — Telephone Encounter (Signed)
Message left for patient

## 2016-01-20 NOTE — Telephone Encounter (Signed)
Yes this is with the prior authorization being approved.

## 2016-02-01 ENCOUNTER — Other Ambulatory Visit: Payer: Self-pay | Admitting: Family Medicine

## 2016-03-02 ENCOUNTER — Ambulatory Visit (INDEPENDENT_AMBULATORY_CARE_PROVIDER_SITE_OTHER): Payer: Medicare HMO

## 2016-03-02 ENCOUNTER — Encounter: Payer: Self-pay | Admitting: Family Medicine

## 2016-03-02 ENCOUNTER — Ambulatory Visit (INDEPENDENT_AMBULATORY_CARE_PROVIDER_SITE_OTHER): Payer: Medicare HMO | Admitting: Family Medicine

## 2016-03-02 VITALS — BP 107/57 | HR 57 | Temp 97.8°F | Ht 65.0 in | Wt 140.0 lb

## 2016-03-02 DIAGNOSIS — R05 Cough: Secondary | ICD-10-CM

## 2016-03-02 DIAGNOSIS — J300 Vasomotor rhinitis: Secondary | ICD-10-CM | POA: Diagnosis not present

## 2016-03-02 DIAGNOSIS — R059 Cough, unspecified: Secondary | ICD-10-CM

## 2016-03-02 DIAGNOSIS — I150 Renovascular hypertension: Secondary | ICD-10-CM | POA: Diagnosis not present

## 2016-03-02 MED ORDER — MONTELUKAST SODIUM 10 MG PO TABS: 10.0000 mg | ORAL_TABLET | Freq: Every day | ORAL | 3 refills | Status: AC

## 2016-03-02 NOTE — Patient Instructions (Signed)
Try montelukast for the cough and runny nose. If it is unaffordable, use over-the-counter fexofenadine also known as Allegra 180 mg daily.  I would like for you to reconsider using a nasal spray. If you're willing, Flonase is available without a prescription. The store brand is usually affordable. Use 2 puffs every morning and 2 every evening in each side of your nose

## 2016-03-02 NOTE — Progress Notes (Signed)
Subjective:  Patient ID: Jonathon Wang, male    DOB: June 26, 1950  Age: 65 y.o. MRN: [id number]  CC: Hypertension   HPI Jonathon Wang presents for  follow-up of hypertension. Patient has no history of headache chest pain or shortness of breath or recent cough. Patient also denies symptoms of TIA such as numbness weakness lateralizing. Patient checks  blood pressure at home and has not had any elevated readings recently. Patient denies side effects from his medication. States taking it regularly.  Has a lot of clear rhinorrhea and cough. These been going on for at least 2 years.   History Jonathon Wang has a past medical history of CKD (chronic kidney disease); GERD (gastroesophageal reflux disease); Hypertension; and Stroke [facility]).   He has a past surgical history that includes Spine surgery; Rotator cuff repair (Right); and Cholecystectomy.   His family history includes Cancer (age of onset: 35) in his brother; Heart disease in his brother and father; Kidney disease in his brother and mother; Stroke in his father.He reports that he has been smoking Cigarettes.  He started smoking about 51 years ago. He has been smoking about 0.50 packs per day. He has never used smokeless tobacco. He reports that he drinks about 1.2 oz of alcohol per week . His drug history is not on file.    ROS Review of Systems  Constitutional: Negative for chills, diaphoresis and fever.  HENT: Positive for rhinorrhea. Negative for sore throat.   Respiratory: Positive for cough. Negative for shortness of breath.   Cardiovascular: Negative for chest pain.  Gastrointestinal: Negative for abdominal pain.  Musculoskeletal: Negative for arthralgias and myalgias.  Skin: Negative for rash.  Neurological: Negative for weakness and headaches.    Objective:  BP (!) 107/57   Pulse (!) 57   Temp 97.8 F (36.6 C) (Oral)   Ht _0  (1.651 m)   Wt 140 lb (63.5 kg)   BMI 23.30 kg/m   BP Readings from Last 3 Encounters:    03/02/16 (!) 107/57  12/02/15 (!) 148/62  07/29/15 (!) 157/83    Wt Readings from Last 3 Encounters:  03/02/16 140 lb (63.5 kg)  12/02/15 141 lb 4 oz (64.1 kg)  07/29/15 135 lb 9.6 oz (61.5 kg)     Physical Exam  Constitutional: He is oriented to person, place, and time. He appears well-developed and well-nourished. No distress.  HENT:  Head: Normocephalic and atraumatic.  Right Ear: External ear normal.  Left Ear: External ear normal.  Nose: Mucosal edema (with pallor) present.  Mouth/Throat: Oropharynx is clear and moist.  Eyes: Conjunctivae and EOM are normal. Pupils are equal, round, and reactive to light.  Neck: Normal range of motion. Neck supple. No thyromegaly present.  Cardiovascular: Normal rate, regular rhythm and normal heart sounds.   No murmur heard. Pulmonary/Chest: Effort normal and breath sounds normal. No respiratory distress. He has no wheezes. He has no rales.  Abdominal: Soft. Bowel sounds are normal. He exhibits no distension. There is no tenderness.  Lymphadenopathy:    He has no cervical adenopathy.  Neurological: He is alert and oriented to person, place, and time. He has normal reflexes.  Skin: Skin is warm and dry.  Psychiatric: He has a normal mood and affect. His behavior is normal. Judgment and thought content normal.     Lab Results  Component Value Date   WBC 6.7 07/08/2015   HCT 37.1 (L) 07/08/2015   PLT 374 07/08/2015   GLUCOSE 125 (H) 07/08/2015  CHOL 178 01/24/2015   TRIG 179 (H) 01/24/2015   HDL 60 01/24/2015   LDLCALC 82 01/24/2015   ALT 10 01/24/2015   AST 19 01/24/2015   NA 139 07/08/2015   K 5.4 (H) 07/08/2015   CL 100 07/08/2015   CREATININE 1.64 (H) 07/08/2015   BUN 32 (H) 07/08/2015   CO2 24 07/08/2015    Mr Lumbar Spine Wo Contrast  Result Date: 05/18/2015 CLINICAL DATA:  Low back pain radiating to both hips for 5 months. EXAM: MRI LUMBAR SPINE WITHOUT CONTRAST TECHNIQUE: Multiplanar, multisequence MR imaging of  the lumbar spine was performed. No intravenous contrast was administered. COMPARISON:  03/30/2015 FINDINGS: The lowest lumbar type non-rib-bearing vertebra is labeled as L5. The conus medullaris appears normal. Conus level: L1. There is 3 mm degenerative retrolisthesis at L5-S1 with considerable loss of intervertebral disc height at this level in addition to mild type 1 degenerative endplate findings. Disc desiccation is present at this level and also at L1-2 and L4-5. Potentially complex renal cysts on the left as shown on CT from 01/25/2015. Right nephrectomy. Additional findings at individual levels are as follows: T12-L1: No impingement. Mild disc bulge. This level is only included on the parasagittal images. L1-2: Moderate left subarticular lateral recess stenosis with mild left foraminal stenosis and mild displacement of the left L1 nerve in the lateral extraforaminal space due to disc bulge, left lateral recess and foraminal disc protrusion, and mild facet arthropathy. Mild left eccentric central narrowing of the thecal sac. L2-3: Mild left and borderline right subarticular lateral recess stenosis and borderline central narrowing of the thecal sac due to short pedicles and mild disc bulge. L3-4: Prominent central narrowing of the thecal sac with mild left foraminal stenosis, moderate bilateral subarticular lateral recess stenosis, and mild displacement of both L3 nerves in the lateral extraforaminal space is due to disc bulge, facet arthropathy, and short pedicles. L4-5: Moderate central narrowing of the thecal sac with moderate left and mild right foraminal stenosis, mild to moderate left and mild right subarticular lateral recess stenosis, and mild displacement of both L4 nerves in the lateral extraforaminal space is due to disc bulge, short pedicles, facet arthropathy, and intervertebral spurring. There is a small synovial cyst posterior to the left facet joint and not in a position to cause impingement.  L5-S1: Moderate left and mild to moderate right foraminal stenosis with potential mild right subarticular lateral recess stenosis due to intervertebral spurring, facet arthropathy, and suspected recurrent right lateral recess disc protrusion along with disc bulge. There has been prior right laminectomy and partial facetectomy at this level and accordingly some of the findings in the right lateral recess could be due to epidural fibrosis, but I favor recurrent disc protrusion given the posterior deviation of the S1 nerve roots. IMPRESSION: 1. Lumbar spondylosis and degenerative disc disease, causing prominent impingement at L3-4 ; moderate impingement at L1-2, L4-5, and L5-S1; and mild impingement at L2-3, as detailed above. 2. Small complex left renal cystic lesions as shown on prior CT, technically nonspecific on MRI. Electronically Signed   By: Van Clines M.D.   On: 05/18/2015 11:47    Assessment & Plan:   Jonathon Wang was seen today for hypertension.  Diagnoses and all orders for this visit:  Renovascular hypertension -     CBC with Differential/Platelet -     CMP14+EGFR  Cough -     DG Chest 2 View; Future -     CBC with Differential/Platelet  Chronic vasomotor rhinitis  Other orders -     montelukast (SINGULAIR) 10 MG tablet; Take 1 tablet (10 mg total) by mouth at bedtime.    Try montelukast for the cough and runny nose. If it is unaffordable, use over-the-counter fexofenadine also known as Allegra 180 mg daily.  I would like for you to reconsider using a nasal spray. If you're willing, Flonase is available without a prescription. The store brand is usually affordable. Use 2 puffs every morning and 2 every evening in each side of your nose  I have discontinued Jonathon Wang's beclomethasone. I am also having him start on montelukast. Additionally, I am having him maintain his metoprolol tartrate, lisinopril, omeprazole, gabapentin, tamsulosin, hydrALAZINE, and amLODipine.  Meds  ordered this encounter  Medications  . montelukast (SINGULAIR) 10 MG tablet    Sig: Take 1 tablet (10 mg total) by mouth at bedtime.    Dispense:  30 tablet    Refill:  3     Follow-up: No Follow-up on file.  Claretta Fraise, M.D.

## 2016-03-03 LAB — CBC WITH DIFFERENTIAL/PLATELET
BASOS ABS: 0.1 10*3/uL (ref 0.0–0.2)
Basos: 1 %
EOS (ABSOLUTE): 0.1 10*3/uL (ref 0.0–0.4)
Eos: 2 %
HEMOGLOBIN: 12.6 g/dL — AB (ref 13.0–17.7)
Hematocrit: 38.4 % (ref 37.5–51.0)
Immature Grans (Abs): 0 10*3/uL (ref 0.0–0.1)
Immature Granulocytes: 0 %
LYMPHS ABS: 1.8 10*3/uL (ref 0.7–3.1)
Lymphs: 35 %
MCH: 31.7 pg (ref 26.6–33.0)
MCHC: 32.8 g/dL (ref 31.5–35.7)
MCV: 97 fL (ref 79–97)
MONOCYTES: 17 %
MONOS ABS: 0.9 10*3/uL (ref 0.1–0.9)
NEUTROS ABS: 2.4 10*3/uL (ref 1.4–7.0)
Neutrophils: 45 %
Platelets: 345 10*3/uL (ref 150–379)
RBC: 3.97 x10E6/uL — AB (ref 4.14–5.80)
RDW: 13.6 % (ref 12.3–15.4)
WBC: 5.3 10*3/uL (ref 3.4–10.8)

## 2016-03-03 LAB — CMP14+EGFR
ALBUMIN: 4.4 g/dL (ref 3.6–4.8)
ALT: 15 IU/L (ref 0–44)
AST: 34 IU/L (ref 0–40)
Albumin/Globulin Ratio: 1.6 (ref 1.2–2.2)
Alkaline Phosphatase: 58 IU/L (ref 39–117)
BILIRUBIN TOTAL: 0.6 mg/dL (ref 0.0–1.2)
BUN/Creatinine Ratio: 13 (ref 10–24)
BUN: 24 mg/dL (ref 8–27)
CO2: 23 mmol/L (ref 18–29)
CREATININE: 1.89 mg/dL — AB (ref 0.76–1.27)
Calcium: 9.7 mg/dL (ref 8.6–10.2)
Chloride: 101 mmol/L (ref 96–106)
GFR, EST AFRICAN AMERICAN: 42 mL/min/{1.73_m2} — AB (ref >59)
GFR, EST NON AFRICAN AMERICAN: 36 mL/min/{1.73_m2} — AB (ref >59)
GLOBULIN, TOTAL: 2.7 g/dL (ref 1.5–4.5)
GLUCOSE: 94 mg/dL (ref 65–99)
POTASSIUM: 4.4 mmol/L (ref 3.5–5.2)
Sodium: 141 mmol/L (ref 134–144)
Total Protein: 7.1 g/dL (ref 6.0–8.5)

## 2016-04-05 ENCOUNTER — Other Ambulatory Visit: Payer: Self-pay | Admitting: Family Medicine

## 2016-05-10 ENCOUNTER — Telehealth: Payer: Self-pay | Admitting: Family Medicine

## 2016-05-10 NOTE — Telephone Encounter (Signed)
Patient last seen 03/02/2016, please advise.  Refills of gabapentin & amlodipine, 90 day supply.  Patient has moved to [facility].  Please advise.

## 2016-05-10 NOTE — Telephone Encounter (Signed)
What is the name of the medication? Gabapentin, alopine  Have you contacted your pharmacy to request a refill? yes  Which pharmacy would you like this sent to? [facility] in [place], [place]. Phone [phone] Miss Sonia Side is the pharmacist. 40 day rxs. He is moving there.    Patient notified that their request is being sent to the clinical staff for review and that they should receive a call once it is complete. If they do not receive a call within 24 hours they can check with their pharmacy or our office.

## 2016-05-11 NOTE — Telephone Encounter (Signed)
Patient has recently moved to [place] and is in the process of finding a new physician.  He would like for [place] to refill Gabapentin & Amlodipine to [facility], [place], [place].  Please advise.

## 2016-05-13 NOTE — Telephone Encounter (Signed)
Okay at current level for 6 mos. Thanks ws 

## 2016-05-14 MED ORDER — AMLODIPINE BESYLATE 10 MG PO TABS: 10.0000 mg | ORAL_TABLET | Freq: Every day | ORAL | 1 refills | Status: AC

## 2016-05-14 MED ORDER — GABAPENTIN 800 MG PO TABS: 1600.0000 mg | ORAL_TABLET | Freq: Two times a day (BID) | ORAL | 1 refills | Status: AC

## 2016-05-14 NOTE — Telephone Encounter (Signed)
Left message refills have been sent to the pharmacy

## 2016-06-01 ENCOUNTER — Ambulatory Visit: Payer: Medicare HMO | Admitting: Family Medicine

## 2016-06-07 ENCOUNTER — Other Ambulatory Visit: Payer: Self-pay | Admitting: Family Medicine

## 2016-06-08 ENCOUNTER — Other Ambulatory Visit: Payer: Self-pay | Admitting: Family Medicine

## 2016-06-08 MED ORDER — TAMSULOSIN HCL 0.4 MG PO CAPS: ORAL_CAPSULE | ORAL | 1 refills | Status: AC

## 2016-06-08 NOTE — Telephone Encounter (Signed)
done

## 2016-06-08 NOTE — Telephone Encounter (Signed)
What is the name of the medication? Tamsulosin 0.4 mg  Have you contacted your pharmacy to request a refill? yes  Which pharmacy would you like this sent to? [facility] on [address], [place]   Patient notified that their request is being sent to the clinical staff for review and that they should receive a call once it is complete. If they do not receive a call within 24 hours they can check with their pharmacy or our office.

## 2016-09-25 IMAGING — CR DG PELVIS 1-2V
1 series · 1 of 1 positions shown · non-contrast
Comparison: None.

CLINICAL DATA: Right thigh pain.  No known injury.

EXAM:
PELVIS - 1-2 VIEW

[view not recorded]
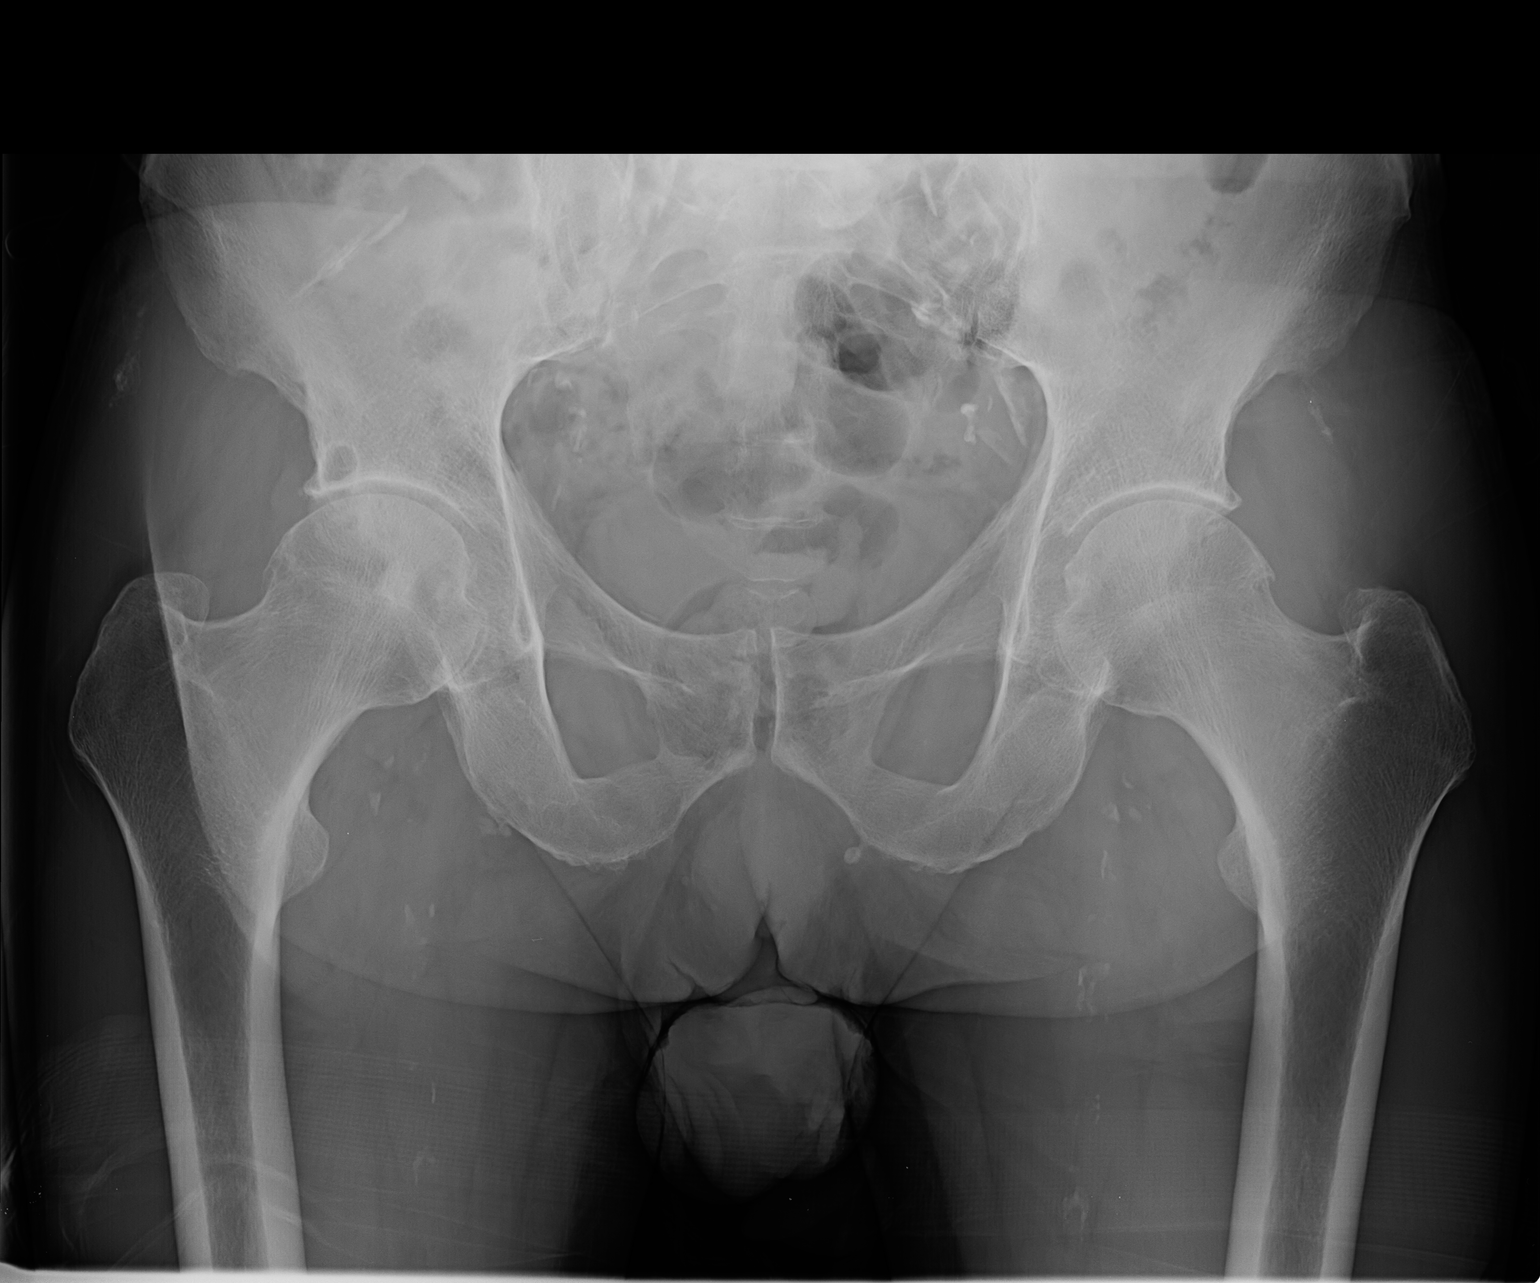

[1 of 1 positions shown; findings below may reference images not displayed]

FINDINGS: Osseous alignment is normal. No fracture line or displaced fracture
fragment seen. No acute- appearing cortical irregularity or osseous
lesion.

Mild degenerative change noted at each hip joint, with associated
mild joint space narrowing and minimal osseous spurring. No large
osteophytes or other secondary signs of advanced degenerative joint
disease. Sacroiliac joint spaces appear grossly well aligned,
although portions are obscured by overlying bowel gas.

Prominent atherosclerotic vascular calcifications noted about the
pelvis and upper thigh regions. Soft tissues otherwise unremarkable.
IMPRESSION: 1. No acute findings.
2. Mild degenerative change at each hip joint.
3. Prominent atherosclerotic vascular calcifications within the soft
tissues about the pelvis and upper thigh regions.

## 2016-09-25 IMAGING — CR DG ABDOMEN ACUTE W/ 1V CHEST
3 series · 3 of 3 positions shown · non-contrast
Comparison: None.

CLINICAL DATA: Periumbilical pain

EXAM:
DG ABDOMEN ACUTE W/ 1V CHEST

[view not recorded (1 of 3)]
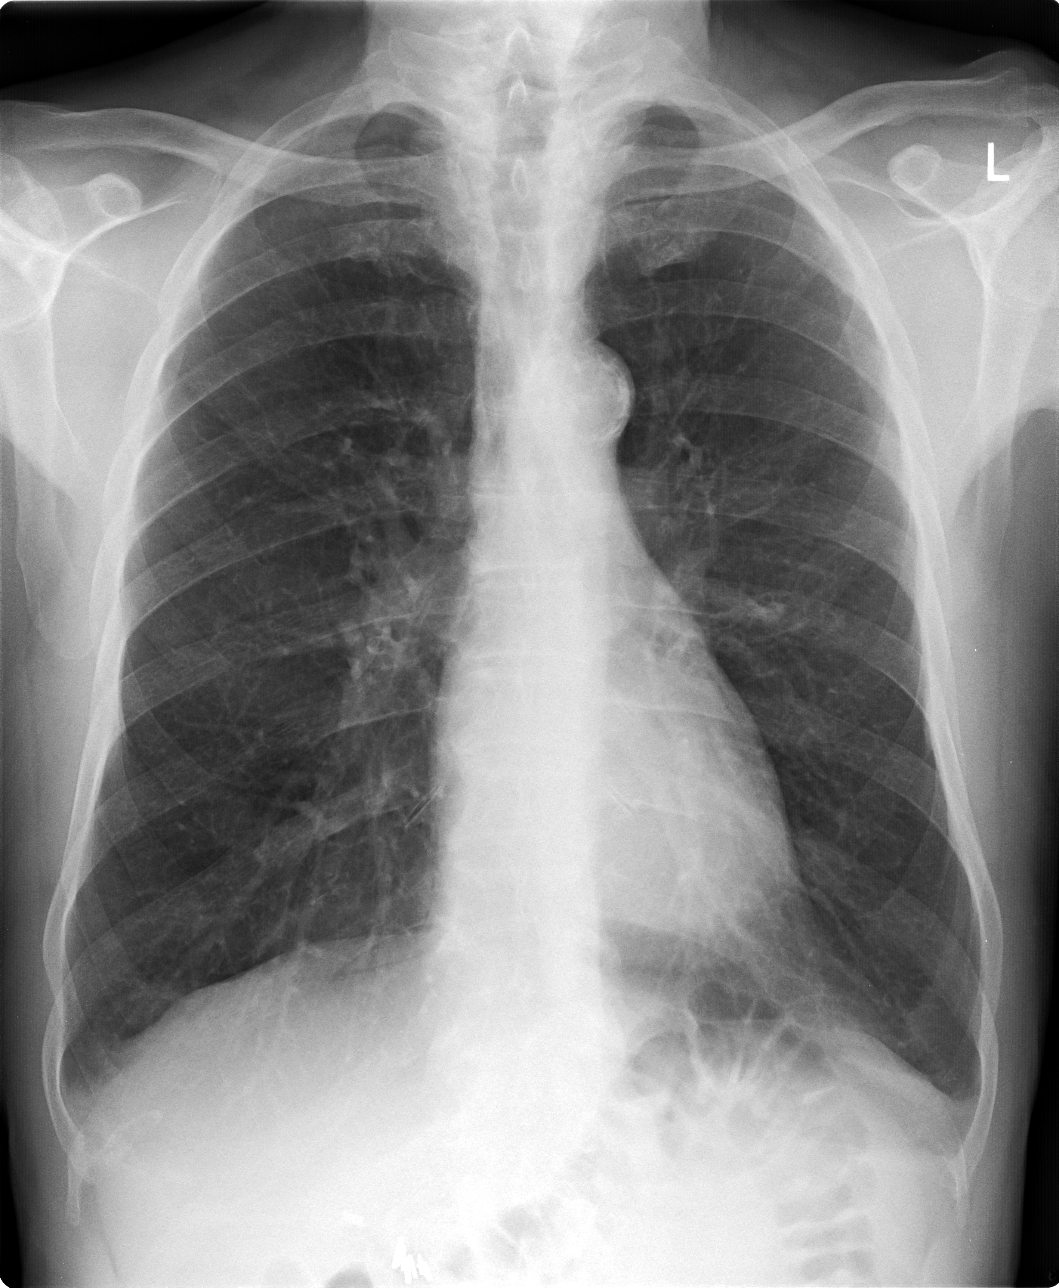

[view not recorded (2 of 3)]
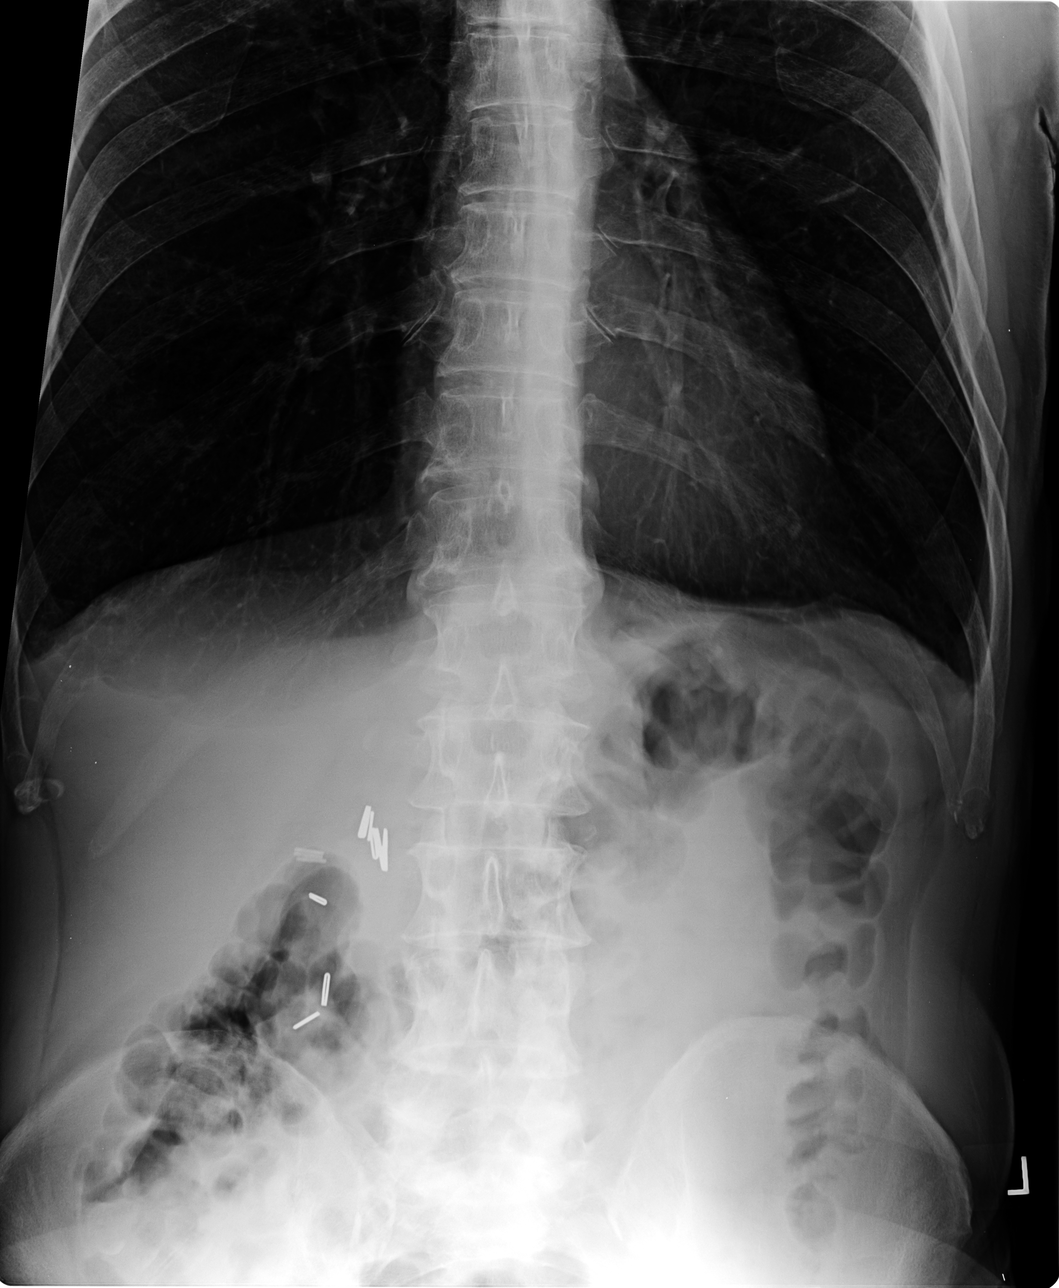

[view not recorded (3 of 3)]
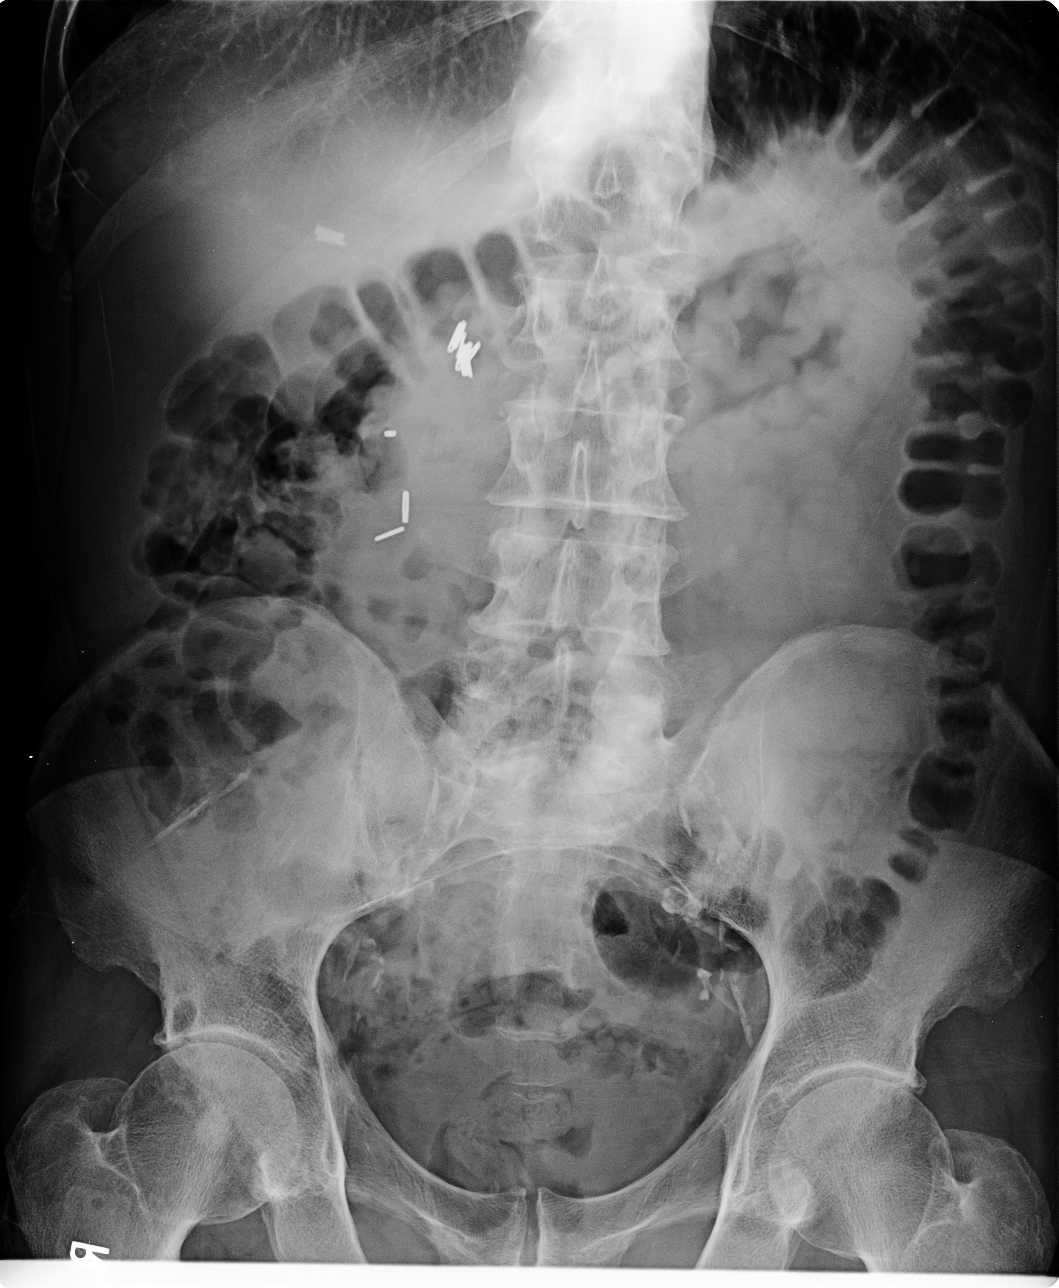

[3 of 3 positions shown; findings below may reference images not displayed]

FINDINGS: Cardiac shadow is within normal limits. The lungs are hyperinflated
consistent with COPD.

Scattered large and small bowel gas is noted. Postoperative changes
are seen. Diffuse vascular calcifications are noted. No free air is
seen. Thickening of the gastric folds is noted although this may be
related to incomplete distension. No other focal abnormality is
seen.
IMPRESSION: COPD.

Nonspecific abdomen although prominence of the gastric folds is
noted. This is of uncertain significance and may simply be related
to incomplete distension.

## 2016-09-25 IMAGING — CR DG FEMUR 2+V*R*
5 series · 5 of 5 positions shown · non-contrast
Comparison: None.

CLINICAL DATA: Right thigh pain.

EXAM:
RIGHT FEMUR 2 VIEWS

[view not recorded (1 of 5)]
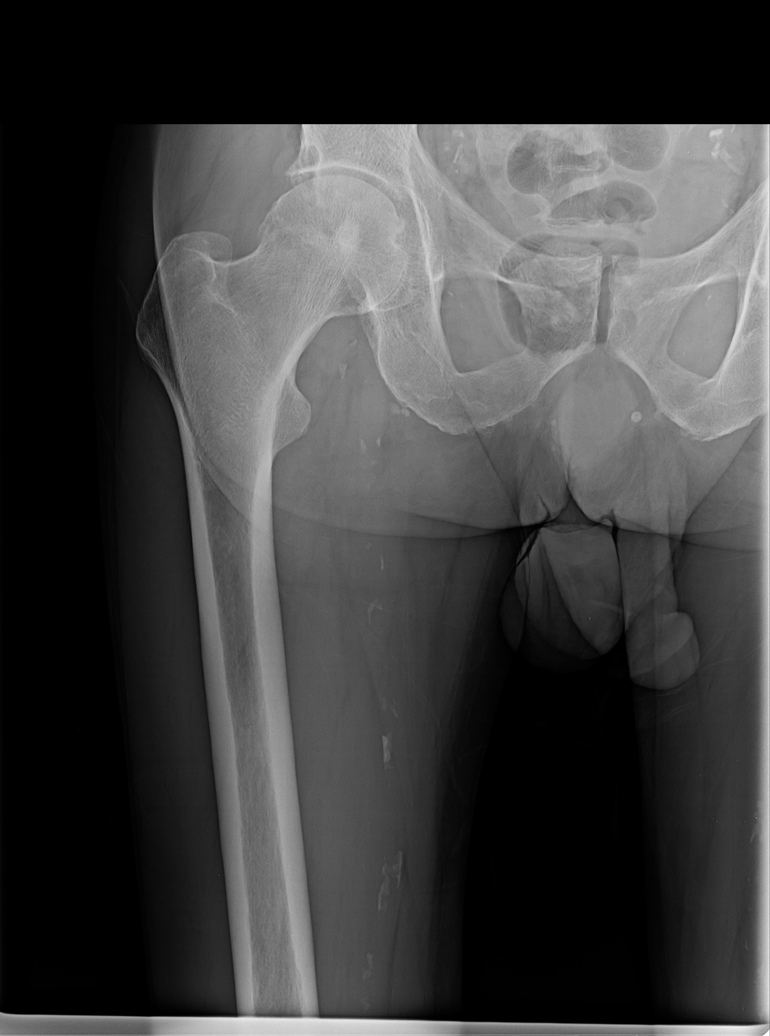

[view not recorded (2 of 5)]
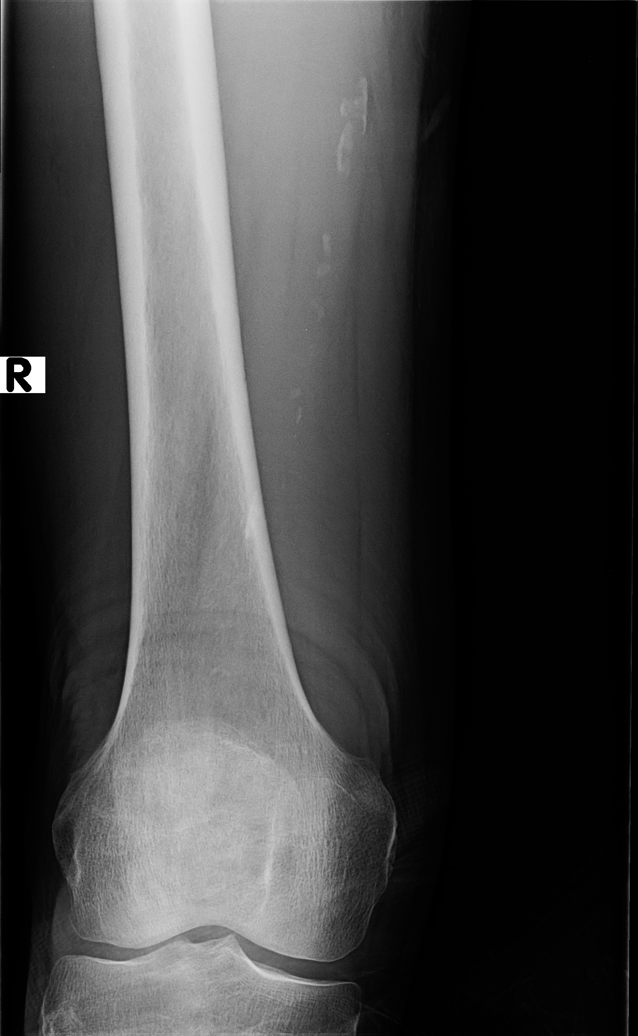

[view not recorded (3 of 5)]
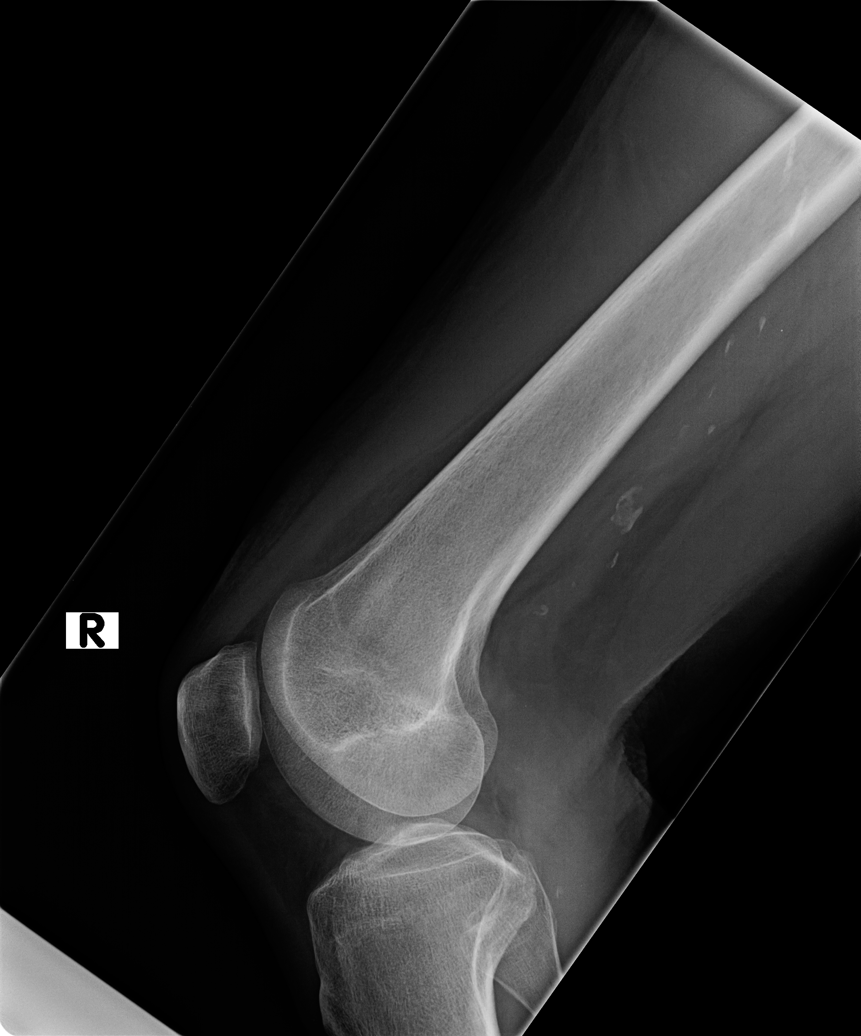

[view not recorded (4 of 5)]
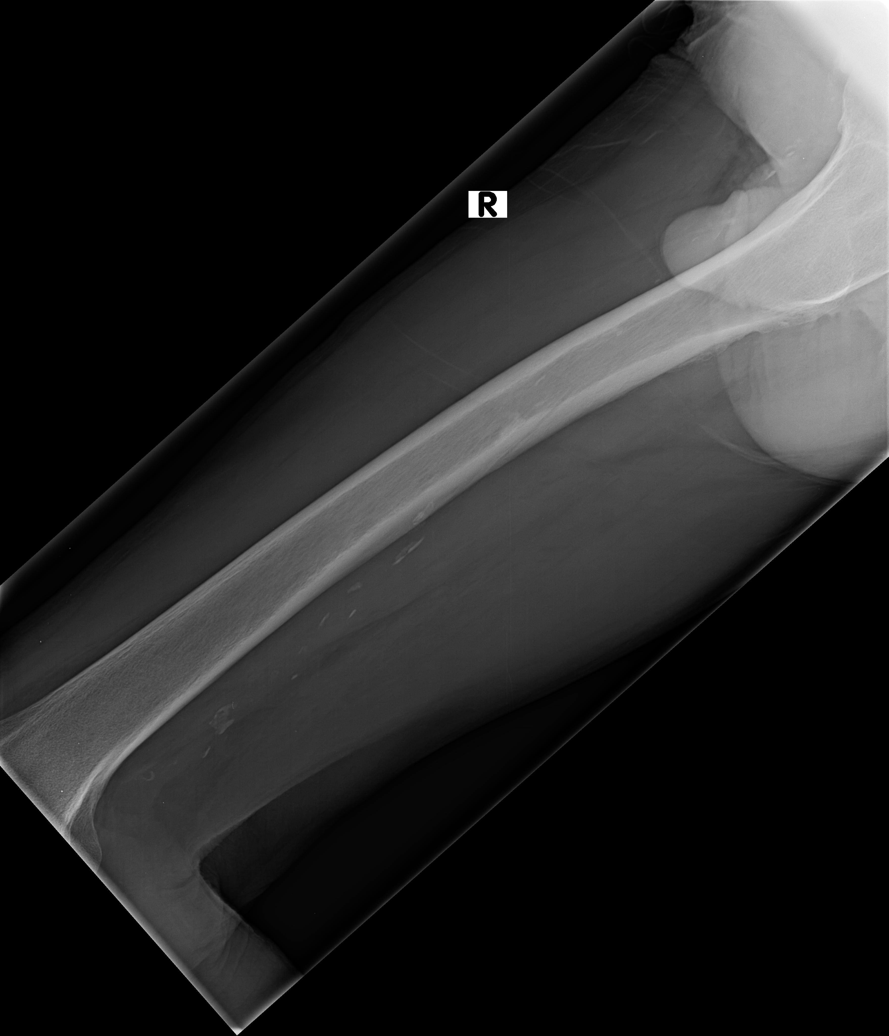

[view not recorded (5 of 5)]
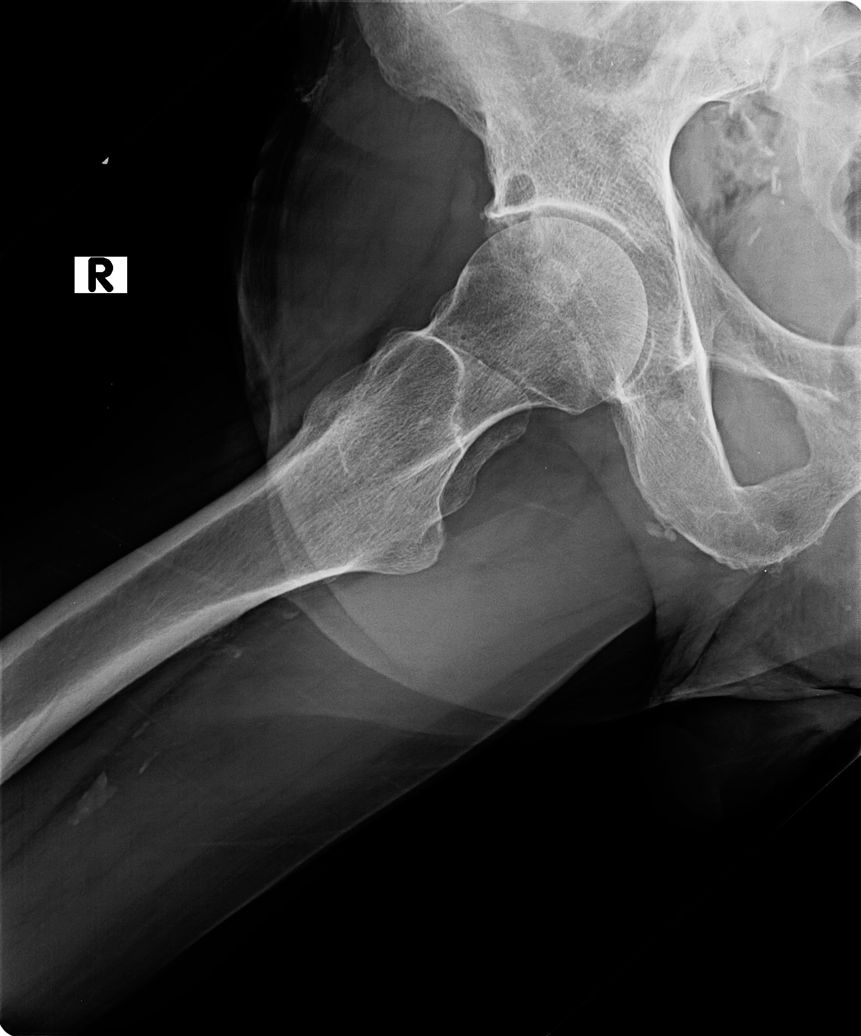

[5 of 5 positions shown; findings below may reference images not displayed]

FINDINGS: No fracture. Small focus of sclerosis in the right femoral head is
likely a benign bone island. No osteolytic lesions.

There is mild superior lateral hip joint space narrowing.
Subchondral cystic changes noted along the superior lateral right
acetabulum. Minor marginal osteophyte formation is noted along the
base of the superior right femoral head.

No other arthropathic change.

There are scattered vascular calcifications noted along the iliac
and femoral arteries on the right. Some nonspecific soft tissue
calcification is evident above the right hip joint. The Soft tissues
otherwise unremarkable.
IMPRESSION: 1. No fracture or dislocation.
2. Mild degenerative changes of the right hip.
